# Patient Record
Sex: Female | Born: 1975 | Race: White | Hispanic: No | Marital: Married
Health system: Midwestern US, Community
[De-identification: ages and names within clinical notes are randomized; demographics above are authoritative.]

## PROBLEM LIST (undated history)

## (undated) DIAGNOSIS — G43109 Migraine with aura, not intractable, without status migrainosus: Secondary | ICD-10-CM

## (undated) DIAGNOSIS — M2550 Pain in unspecified joint: Secondary | ICD-10-CM

## (undated) DIAGNOSIS — R109 Unspecified abdominal pain: Secondary | ICD-10-CM

## (undated) DIAGNOSIS — M19049 Primary osteoarthritis, unspecified hand: Secondary | ICD-10-CM

## (undated) DIAGNOSIS — F419 Anxiety disorder, unspecified: Secondary | ICD-10-CM

## (undated) DIAGNOSIS — J45909 Unspecified asthma, uncomplicated: Secondary | ICD-10-CM

## (undated) DIAGNOSIS — M0609 Rheumatoid arthritis without rheumatoid factor, multiple sites: Principal | ICD-10-CM

## (undated) DIAGNOSIS — J40 Bronchitis, not specified as acute or chronic: Secondary | ICD-10-CM

## (undated) DIAGNOSIS — Z79899 Other long term (current) drug therapy: Secondary | ICD-10-CM

## (undated) DIAGNOSIS — F411 Generalized anxiety disorder: Secondary | ICD-10-CM

## (undated) DIAGNOSIS — H60502 Unspecified acute noninfective otitis externa, left ear: Secondary | ICD-10-CM

## (undated) DIAGNOSIS — R768 Other specified abnormal immunological findings in serum: Secondary | ICD-10-CM

## (undated) DIAGNOSIS — M1990 Unspecified osteoarthritis, unspecified site: Secondary | ICD-10-CM

## (undated) DIAGNOSIS — M359 Systemic involvement of connective tissue, unspecified: Secondary | ICD-10-CM

## (undated) DIAGNOSIS — M0600 Rheumatoid arthritis without rheumatoid factor, unspecified site: Principal | ICD-10-CM

---

## 2008-12-19 NOTE — Telephone Encounter (Signed)
I sent the Rx to the pharmacy.

## 2008-12-19 NOTE — Telephone Encounter (Signed)
RX for Aciphex 40mg  1xday marked over, please clear what is needed  [facility]  [phone]

## 2008-12-23 NOTE — Telephone Encounter (Signed)
Called Friday regarding 8/09 script filled 12/09, questions/double checking the total script Aciphex 40mg  (comes only in a 20mg ) Please clarify everything spoke with [place]y on Friday   [facility] [phone]

## 2009-04-30 NOTE — Progress Notes (Signed)
Subjective:      Patient ID: Peggy Sanders is a 33 y.o. female.    HPI  Patient has wakened in the night several times in the past month feeling like she was choking.  Last night she had an episode in which she woke up feeling like she was choking and then started vomiting yellow mocous.  Feels different than acid reflux, taking aciphex regularly.  She does have a h/o hiatal hernia- last evaluated about 5 years ago. No congestion.  She does have intermittent right sided sharp abdominal pain.  Nausea today.  She has not eaten today.  NO fever.  No change in appetite.  She has had some loose stools, yellow in color.  Mom had gallbladder issues.  NO sick contacts.  Last night, ate grilled chicken and mashed potatoes.    She also does have frequent snoring and wakes up frequently.  She also has daytime fatigue.    She quit smoking 2 weeks ago    Review of Systems   Constitutional: Negative for fatigue and unexpected weight change.   HENT: Negative for congestion.    Respiratory: Negative for cough, chest tightness and wheezing.    Gastrointestinal: Positive for nausea, vomiting and abdominal pain.       Objective:   Physical Exam   Nursing note and vitals reviewed.  Constitutional: She is oriented to person, place, and time. She appears well-developed and well-nourished.   HENT:   Head: Normocephalic and atraumatic.   Mouth/Throat: No oropharyngeal exudate.   Eyes: Conjunctivae are normal. Pupils are equal, round, and reactive to light.   Cardiovascular: Normal rate, regular rhythm and normal heart sounds.    Pulmonary/Chest: Effort normal and breath sounds normal. She has no wheezes.   Abdominal: Soft. Bowel sounds are normal. She exhibits no distension. Tenderness is present. She has no rebound and no guarding.        RUQ and RLQ tenderness to palpation   Neurological: She is alert and oriented to person, place, and time.   Psychiatric: Her behavior is normal.       Assessment/Plan      1) RUQ pain/nausea,  vomiting- check lfts, consider RUQ U/S  2) Snoring/Fatigue- sleep study

## 2009-05-01 ENCOUNTER — Telehealth

## 2009-05-01 LAB — COMPREHENSIVE METABOLIC PANEL
ALT: 26 U/L (ref 10–40)
AST: 20 U/L (ref 15–37)
Albumin/Globulin Ratio: 1.4 (ref 1.1–2.2)
Albumin: 4.3 g/dL (ref 3.4–5.0)
Alkaline Phosphatase: 74 U/L
BUN: 8 mg/dL (ref 7–18)
CO2: 22 meq/L (ref 21–32)
Calcium: 9.2 mg/dL (ref 8.3–10.6)
Chloride: 108 meq/L (ref 99–110)
Creatinine: 0.7 mg/dL (ref 0.6–1.1)
GFR Est, African/Amer: >60
GFR, Estimated: >60 (ref >60)
Glucose: 88 mg/dL (ref 70–99)
Potassium: 3.6 meq/L (ref 3.5–5.1)
Sodium: 140 meq/L (ref 136–145)
Total Bilirubin: 0.5 mg/dL (ref 0.0–1.0)
Total Protein: 7.3 g/dL (ref 6.4–8.2)

## 2009-05-01 LAB — CBC
Hematocrit: 42.8 % (ref 36.0–48.0)
Hemoglobin: 14.9 g/dL (ref 12.0–16.0)
MCH: 32.4 pg (ref 26–34)
MCHC: 34.7 g/dL (ref 31–36)
MCV: 93.4 fl (ref 80–100)
MPV: 6.9 fl (ref 5.0–10.5)
Platelets: 293 10*3 (ref 135–450)
RBC: 4.58 10*6 (ref 4.0–5.2)
RDW: 12.6 % (ref 11.5–14.5)
WBC: 9.7 10*3 (ref 4.0–11.0)

## 2009-05-01 NOTE — Telephone Encounter (Signed)
Spoke with the patient and she said that she wanted the results of the blood work she is still throwing up yellow bile and also yellowish diarrhea. She did not go to work today, still not feeling well.

## 2009-05-01 NOTE — Telephone Encounter (Signed)
I spoke with her.  BW results OK. Will Order u/s.  ER if increased sx

## 2009-05-01 NOTE — Telephone Encounter (Signed)
bw results

## 2009-05-01 NOTE — Telephone Encounter (Signed)
Calling back concern on BW results

## 2009-05-01 NOTE — Telephone Encounter (Signed)
BW results were normal.  Will order RUQ u/s.  If sx severe, should go to ER

## 2009-06-17 NOTE — Telephone Encounter (Signed)
Patient c/o cough, ear pain, and sore throat for 1 day.  No fever.  Cough is non- productive.  She feels like she is breathing in cold air.  She is taking Nyquil with minimal help.  States allergic to codeine but only in pill form.  Phenergan with codeine called to [facility] 150 ml No RFs  Call for OV in am

## 2009-06-19 NOTE — Progress Notes (Signed)
Subjective:      Patient ID: Peggy Sanders is a 33 y.o. female.    HPI  Patient has been having cough and shortness of breath since Monday night.  She is wheezing.  She is also having a lot of sinus pressure and drainage.  No fevers.  Body aches.  She is taking phenergan with codeine which helps with the cough.  Decreased appetite.  Min fluids.      Review of Systems   Constitutional: Positive for fatigue. Negative for unexpected weight change.   Respiratory: Positive for cough, shortness of breath and wheezing. Negative for chest tightness.    Cardiovascular: Negative for chest pain.   Neurological: Negative for dizziness.   Psychiatric/Behavioral: The patient is not nervous/anxious.        Objective:   Physical Exam   Nursing note and vitals reviewed.  Constitutional: She appears well-developed and well-nourished.   HENT:   Head: Normocephalic and atraumatic.   Mouth/Throat: Oropharynx is clear and moist.   Eyes: Conjunctivae are normal. Pupils are equal, round, and reactive to light.   Cardiovascular: Normal rate, regular rhythm and normal heart sounds.    Pulmonary/Chest: She has wheezes.        Decreased breath sounds throughout       Assessment/Plan      1) Bronchitis- albuterol neb x 2, z pack, albuterol MDI, RF phenergan with codeine, encouraged cont tobacco cessation

## 2009-06-20 ENCOUNTER — Telehealth

## 2009-06-20 NOTE — Telephone Encounter (Signed)
Spoke with pt she was here yest and had nebulizer rx felt much better after treatment inhaler not working as well chest tight worse when lying down not getting any rest on cough med and antibiotic  Dr peeden told her we could order nebulizer if she needed  It Talked to [facility] they can get nebulizer need to fax order for meds and demographic sheet to them

## 2009-06-20 NOTE — Telephone Encounter (Signed)
Patient called and said that husband would be willing to pick up the nebulizer [phone] if necessary, call so that she knows the status of this request. thanks

## 2009-06-20 NOTE — Telephone Encounter (Signed)
Still wheezing and feeling bad.  Said Dr. Bethena Roys told her she could get a temporary breathing treatment maching for home.

## 2009-06-23 NOTE — Telephone Encounter (Signed)
Later on Friday evening, pt called back - [facility] did not have any medicine (albuterol) - I called in albuterol unit dose nebules (2.5 mL), #60 and no refills, to use q 4-6 hrs prn, to her requested pharmacy, that evening (20/2/10).

## 2009-06-23 NOTE — Telephone Encounter (Signed)
I called pt that evening - [facility] to deliver nebulizer Friday evening.

## 2009-11-03 MED ORDER — RIZATRIPTAN BENZOATE 10 MG PO TBDP
10 | ORAL_TABLET | Freq: Once | ORAL | Status: DC | PRN
Start: 2009-11-03 — End: 2010-09-22

## 2009-11-03 NOTE — Progress Notes (Signed)
Subjective:      Patient ID: Peggy Sanders is a 34 y.o. female.    HPI  Patient has had a bump in right wrist that has been getting larger and is now painful.  She works at a [employer] and does a lot of repetitive activity with her wrist.  No known trauma or history of previous cysts.    She is also interested in quitting smoking.  She smokes less that 1/2 ppd for several years.  She is interested in starting Chantix. She is concerned about weight gain if she quits smoking.    Review of Systems   Constitutional: Negative for unexpected weight change.   Respiratory: Negative for chest tightness and shortness of breath.    Cardiovascular: Negative for chest pain.   Musculoskeletal:        Pain on right wrist   Neurological: Negative for dizziness.   Psychiatric/Behavioral: Positive for sleep disturbance. The patient is not nervous/anxious.        Objective:   Physical Exam   Cardiovascular: Normal rate and regular rhythm.    Pulmonary/Chest: Effort normal and breath sounds normal.   Musculoskeletal:        1 cm palpable mobile cyst right lateral wrist that is ttp.       Assessment/Plan:      1) Cyst right wrist- refer ortho  2) Tobacco use- trial Chantix, discussed potential s/e of medication, encouraged regular exercise to help combat any potential weight gain

## 2009-11-12 NOTE — Telephone Encounter (Signed)
OV

## 2009-11-12 NOTE — Telephone Encounter (Signed)
Trying to schedule surgery for 11-14-09.  Needs pre-op paperwork filled out before hand.  Do you need to see her or can she just drop the forms off to be filled out.  She states you just saw her last week.

## 2009-11-12 NOTE — Telephone Encounter (Signed)
Office visit made.

## 2009-11-13 NOTE — Progress Notes (Signed)
[facility]   Pre-operative History and Physical      DIAGNOSIS:  Cyst right wrist    PROCEDURE:  Removal of cyst right wrist      History Obtained From:  patient    HISTORY OF PRESENT ILLNESS:    The patient is a 34 y.o. female who presents for pre-operative evaluation.    Past Medical History:    Past Medical History   Diagnosis Date   ??? GERD (gastroesophageal reflux disease)    ??? PCOS (polycystic ovarian syndrome)        Past Surgical History:    Past Surgical History   Procedure Date   ??? Tubal ligation    ??? Atrial ablation surgery    ??? Adenoidectomy    ??? Inner ear surgery      tubes inserted       Current Medication  Current outpatient prescriptions   Medication Sig Dispense Refill   ??? rabeprazole (ACIPHEX) 20 MG tablet Take 1 tablet by mouth 2 times daily.  60 tablet  6   ??? rizatriptan (MAXALT-MLT) 10 MG disintegrating tablet Take 1 tablet by mouth once as needed for Migraine for 1 dose. May repeat in 2 hours if needed  9 tablet  3   ??? varenicline (CHANTIX STARTING MONTH PAK) 0.5 MG X 11 & 1 MG X 42 tablet Take  by mouth See Admin Instructions for 90 days. Take as directed  1 tablet  0   ??? varenicline (CHANTIX CONTINUING MONTH PAK) 1 MG tablet Take 1 tablet by mouth 2 times daily for 90 days.  60 tablet  2   ??? albuterol (PROVENTIL) (2.5 MG/3ML) 0.083% nebulizer solution Take 3 mLs by nebulization every 6 hours as needed for Wheezing.  60 vial  0           Allergies:  Codeine  History of allergic reaction to anesthesia:  No  Teeth: intact    Social History:   History   Smoking status   ??? Former Smoker   ??? Quit date: 04/20/2009   Smokeless tobacco   ??? Never Used       The patient states she drinks 0 per week.    Family History:   Family History   Problem Relation Age of Onset   ??? Other Mother      lykinplantis   ??? Heart Attack Father    ??? Other Father          REVIEW OF SYSTEMS:    CONSTITUTIONAL:  negative  EYES:  negative  HEENT:  negative  RESPIRATORY:  negative  CARDIOVASCULAR:   negative  GASTROINTESTINAL:  negative  GENITOURINARY:  negative  INTEGUMENT/BREAST:  negative  HEMATOLOGIC/LYMPHATIC:  negative  ALLERGIC/IMMUNOLOGIC:  negative  ENDOCRINE:  negative  MUSCULOSKELETAL:  negative  NEUROLOGICAL:  negative    PHYSICAL EXAM:      BP 110/62   Pulse 80   Ht 5' 3.5" (1.613 m)   Wt 275 lb 6.4 oz (124.921 kg)   BMI 48.02 kg/m2      Eyes:  Lids and lashes normal, pupils equal, round and reactive to light, extra ocular muscles intact, sclera clear, conjunctiva normal    Head/ENT:  Normocephalic, without obvious abnormality, atramatic, sinuses nontender on palpation, external ears without lesions, oral pharynx with moist mucus membranes, tonsils without erythema or exudates, gums normal and good dentition.    Neck:  Supple, symmetrical, trachea midline, no adenopathy, thyroid symmetric, not enlarged and no tenderness, skin normal  Heart:  Normal apical impulse, regular rate and rhythm, normal S1 and S2, no S3 or S4, and no murmur noted    Lungs:  No increased work of breathing, good air exchange, clear to auscultation bilaterally, no crackles or wheezing    Abdomen:  No scars, normal bowel sounds, soft, non-distended, non-tender, no masses palpated, no hepatosplenomegally    Extremities:  No clubbing, cyanosis, or edema      ASSESSMENT AND PLAN:    1.  Patient is a 34 y.o. female with above specified procedure planned on removal of cyst with Dr. Elige Ko. They will require cardiology evaluation prior to procedure.   2. Decrease tobacco use prior to procedure.

## 2010-01-12 NOTE — Progress Notes (Signed)
Subjective:      Patient ID: Peggy Sanders is a 34 y.o. female.    HPI  Patient states that she has been sick for about 10 days.  She has been using inhaler and singulair and mucinex but still can't breathe.  She is having a lot of facial pressure and chest tightness.  Facial pain is worse on left/  She is having wheezing, worse at night.  Her cough is non-productive. She had fever initially.  No allergy sx.  She is trying to quit smoking but has not started Chantix.      Review of Systems   Constitutional: Positive for fatigue. Negative for unexpected weight change.   HENT: Positive for congestion.    Respiratory: Positive for cough, chest tightness and shortness of breath.    Cardiovascular: Negative for chest pain.   Neurological: Negative for dizziness.   Psychiatric/Behavioral: The patient is not nervous/anxious.        Objective:   Physical Exam   Nursing note and vitals reviewed.  Constitutional: She appears well-developed and well-nourished.   HENT:   Head: Normocephalic.   Right Ear: Tympanic membrane normal.   Left Ear: Tympanic membrane is erythematous. A middle ear effusion is present.   Nose: No mucosal edema, rhinorrhea or septal deviation. Right sinus exhibits no frontal sinus tenderness. Left sinus exhibits no frontal sinus tenderness.   Mouth/Throat: Mucous membranes are normal. No oral lesions. Posterior oropharyngeal edema and posterior oropharyngeal erythema present. No oropharyngeal exudate.   Eyes: Conjunctivae are normal.   Cardiovascular: Normal rate, regular rhythm and normal heart sounds.    Pulmonary/Chest: Effort normal. No respiratory distress. She has wheezes.   Lymphadenopathy:     She has no cervical adenopathy.       Assessment:/Plan:        1) Bronchitis- albuterol neb in office, meds per orders, sample of symbicort, stressed importance of tobacco cessation

## 2010-03-25 NOTE — Progress Notes (Signed)
Subjective:      Peggy Sanders is a 34 y.o. female who presents for possible ear infection. Symptoms include left ear pain, plugged sensation in the left ear and swollen glands. Onset of symptoms was 5 days ago, worsening since that time. Patient states that about 1 week ago she started having eye twitching, left arm twitching didn't think anything of it. About 4-5 days ago started having ear pain and shooting pain upward and downward from left ear. Also has noticed in past 2 days left neck swelling just below left ear.     Review of Systems  Pertinent items are noted in HPI.    Objective:   BP 128/64   Temp 98.1 ??F (36.7 ??C)   Wt 270 lb 3.2 oz (122.562 kg)  General:  alert, appears stated age and cooperative   Right Ear: normal appearance with cholesteatoma   Left Ear: left TM erythematous and cholesteatoma postero-inferior and left canal red, tender with movement of pinna and what appears to be lesion about 10:00 approximately 0.5 cm prior to TM there is some swelling of canal    Mouth:  normal findings: lips normal without lesions and tongue midline and normal and abnormal findings: mild oropharyngeal erythema   Neck: mild anterior cervical adenopathy, supple, symmetrical, trachea midline, thyroid: difficult to exam and some swelling just below ear into jaw line.        Assessment:     left acute otitis externa vs. [place] hunt    Plan:   Labs: cbc w/diff, TSH, HSV & Zoster viral swab.  Treatment: Augmentin  Ibuprofen, fluids, rest, avoid carbonated/alcoholic or caffeinated beverages.   Follow up in 3 days if not improving.

## 2010-03-25 NOTE — Telephone Encounter (Signed)
Ear hurts  Employer has just returned form vacation- and has shingles  She has no rash that she can see, but does have "horrible pain"    For now, use the ear drops, take the 800  Mg IBF, and call if any rash appears in the affected area.   Once cx Lupita Leash did returns, we will let her know

## 2010-03-26 LAB — CBC WITH AUTO DIFFERENTIAL
Basophils %: 0.5 % (ref 0.0–2.0)
Basophils Absolute: 0 10*3 (ref 0.0–0.2)
Eosinophils %: 0.3 % (ref 0.0–5.0)
Eosinophils Absolute: 0 10*3 (ref 0.0–0.6)
Granulocyte Absolute Count: 6.5 10*3 (ref 1.7–7.7)
Hematocrit: 42 % (ref 36.0–48.0)
Hemoglobin: 14.8 gm/dl (ref 12.0–16.0)
Lymphocytes %: 24.9 % — ABNORMAL LOW (ref 25.0–40.0)
Lymphocytes Absolute: 2.3 10*3 (ref 1.0–5.1)
MCH: 31.9 pg (ref 26–34)
MCHC: 35.1 gm/dl (ref 31–36)
MCV: 90.8 fl (ref 80–100)
MPV: 7.2 fl (ref 5.0–10.5)
Monocytes %: 4.9 % (ref 0.0–12.0)
Monocytes Absolute: 0.5 10*3 (ref 0.0–1.3)
Platelets: 356 10*3 (ref 135–450)
RBC: 4.63 10*6 (ref 4.0–5.2)
RDW: 12.7 % (ref 11.5–14.5)
Segs Relative: 69.4 % — ABNORMAL HIGH (ref 42.0–63.0)
WBC: 9.3 10*3 (ref 4.0–11.0)

## 2010-03-26 LAB — TSH: TSH: 2.17 u[IU]/mL (ref 0.35–5.5)

## 2010-03-26 NOTE — Telephone Encounter (Signed)
I spoke with the patient and let her know the results are her but will address them with her tomorrow after you look over them.

## 2010-03-26 NOTE — Telephone Encounter (Signed)
BW and  X-ray results, concern done yesterday/saw Lupita Leashonna

## 2010-03-26 NOTE — Telephone Encounter (Signed)
Blood work including thyroid normal. X ray normal as well. May need to have more imaging of neck if swelling persists. Have not received viral studies/results back yet. Will call when we get that back.

## 2010-03-27 NOTE — Telephone Encounter (Signed)
She wanted to know if cbc was ok as well.

## 2010-03-27 NOTE — Telephone Encounter (Signed)
Called patient. She is still having ear pain only slightly improved she rated 9-10 on a 1-10 scale constant ache with occasional sharp shooting pain with swallowing and certain movements. Result of lesion culture/viral should be back today, will call patient this afternoon with results - if not available today will go ahead and start on Valtrex 1g three times a day for 7 days. She is continuing ear drops as well.

## 2010-03-27 NOTE — Telephone Encounter (Signed)
[employer] talked to [place]. HSV and viral culture still pending. Likely will be another 3 days.  Will go ahead and call in Valtrex 1g TID for 7 days. Will call patient when receive results. If negative may stop Valtrex.

## 2010-04-22 NOTE — Telephone Encounter (Signed)
Needing refills for her rescue inhaler Proventil, pharmacy listed.  (she is out, please call in today, the others were taken care of that this one was missed) thanks

## 2010-04-22 NOTE — Telephone Encounter (Signed)
Last appt 03/25/10

## 2010-09-22 MED ORDER — RIZATRIPTAN BENZOATE 10 MG PO TBDP
10 | ORAL_TABLET | Freq: Once | ORAL | Status: DC | PRN
Start: 2010-09-22 — End: 2013-11-12

## 2010-09-22 NOTE — Patient Instructions (Signed)
Discussed importance of tobacco cessation.

## 2010-09-22 NOTE — Progress Notes (Signed)
Subjective:      Patient ID: Peggy Sanders is a 35 y.o. female.    HPI  Patient states that right ear pain and congestion right side of face that started yesterday.  She thinks low grade fever yesterday.  She is getting thick mucous out of nose.  She was coughing a lot last night and did not sleep at all.  No wheezing but she is starting to have some chest tightness.  No sick contacts.  She has started smoking again.    She is traveling to [place] next week and requesting a prescription of valium to help with anxiety of flying.    Review of Systems   Constitutional: Negative for unexpected weight change.   HENT: Positive for ear pain and congestion.    Respiratory: Positive for cough and chest tightness. Negative for shortness of breath.    Cardiovascular: Negative for chest pain.   Neurological: Negative for dizziness.   Psychiatric/Behavioral: The patient is not nervous/anxious.        Objective:   Physical Exam   Nursing note and vitals reviewed.  Constitutional: She is oriented to person, place, and time. She appears well-developed and well-nourished.   HENT:   Head: Normocephalic and atraumatic.   Right Ear: Tympanic membrane is erythematous and bulging. A middle ear effusion is present.   Nose: Mucosal edema and rhinorrhea present.   Eyes: Conjunctivae are normal. Pupils are equal, round, and reactive to light.   Cardiovascular: Normal rate, regular rhythm and normal heart sounds.    Pulmonary/Chest: Effort normal and breath sounds normal.   Abdominal: Soft. Bowel sounds are normal. No tenderness.   Neurological: She is alert and oriented to person, place, and time.   Psychiatric: She has a normal mood and affect. Her behavior is normal.       Assessment:/Plan:        1) AOM/URI- meds per orders, increase fluids, RF Albuterol for prn use  2) Tobacco use- stressed importance of tobacco cessation, prescription for Chantix, discussed potential side effects of medication    3) Travel- Rx for valium for  flight

## 2011-01-04 MED ORDER — AMOXICILLIN-POT CLAVULANATE ER 1000-62.5 MG PO TB12
ORAL_TABLET | Freq: Two times a day (BID) | ORAL | Status: AC
Start: 2011-01-04 — End: 2011-01-18

## 2011-01-04 NOTE — Patient Instructions (Signed)
Contact office if increased or persistent symptoms.

## 2011-01-04 NOTE — Progress Notes (Signed)
Subjective:      Patient ID: Peggy Sanders is a 35 y.o. female.    HPI  Patient states that she started with congestion about 1 week ago.  This am she states that she is feeling a lot worse.  She is having sinus pressure and chest congestion.  No fevers but she did have achiness last night.  She is taking sudafed and tylenol with minimal help.    Review of Systems   Constitutional: Positive for fatigue. Negative for unexpected weight change.   HENT: Positive for congestion and sinus pressure.    Respiratory: Positive for cough. Negative for chest tightness and shortness of breath.    Cardiovascular: Negative for chest pain.   Neurological: Positive for headaches. Negative for dizziness.   Psychiatric/Behavioral: The patient is not nervous/anxious.        Objective:   Physical Exam   [nursing notereviewed.  Constitutional: She appears well-developed and well-nourished.   HENT:   Head: Normocephalic.   Right Ear: Tympanic membrane normal.   Left Ear: Tympanic membrane is erythematous. A middle ear effusion is present.   Nose: Mucosal edema and rhinorrhea present. No septal deviation. Right sinus exhibits no frontal sinus tenderness. Left sinus exhibits no frontal sinus tenderness.   Mouth/Throat: Mucous membranes are normal. No oral lesions. Posterior oropharyngeal edema and posterior oropharyngeal erythema present. No oropharyngeal exudate.   Eyes: Conjunctivae are normal.   Cardiovascular: Normal rate, regular rhythm and normal heart sounds.    Pulmonary/Chest: Effort normal and breath sounds normal. No respiratory distress. She has no wheezes.   Lymphadenopathy:     She has no cervical adenopathy.       Assessment:/Plan:        1) Sinusitis- antibiotics per orders, increase fluids, Mucinex, call if increased or persistent symptoms

## 2011-01-07 NOTE — Telephone Encounter (Signed)
Left message on recorder  To let the pt know that she has the script at the pharmacy.

## 2011-01-07 NOTE — Telephone Encounter (Signed)
Ok for dexilant 30 mg daily.  Please pit other meds in allergy section.

## 2011-01-07 NOTE — Telephone Encounter (Signed)
I spoke with the pt and she said she has tried the Nexium,Prilosec, and protonix, and they closed up her throat, her insurance covers the Dexilant pt needs to know if she needs 1 tablet or 2 normally the 1 tablet does not work, but she has not tried this medication either.

## 2011-01-08 NOTE — Telephone Encounter (Signed)
Insurance won't cover the dexilant either. She just wants the aciphex prior authorized. She said Lafonda Mosses talked with insurance company on the 17th. She would like Lafonda Mosses to call her at [phone].

## 2011-01-11 NOTE — Telephone Encounter (Signed)
I spoke with the pt and she said her Dexilant is not covered she wants me to get a PA for this medication I will check with the insurance

## 2011-01-12 NOTE — Telephone Encounter (Signed)
Order signed as phone in for phenergan with codeine.  Please call to pharmacy.

## 2011-01-12 NOTE — Telephone Encounter (Signed)
I spoke with the pharmacy and they do not make 40mg  on the Aciphex so I phoned the pt and she will try the Dexilant, I will give her a discount card also for this.  She also needs the cough medication she is still coughing and keeping her up at night,

## 2011-01-14 NOTE — Telephone Encounter (Signed)
Phoned in medication for pt. To pharm per Dr Bethena Roys

## 2011-02-22 NOTE — Telephone Encounter (Signed)
I spoke with the pt and she needs the 60 mg sent to the pharmacy she said the mg works better.

## 2011-02-22 NOTE — Telephone Encounter (Signed)
I sent the Rx to the pharmacy.

## 2011-02-22 NOTE — Telephone Encounter (Signed)
Regarding the new medication/Dexilant, was to be on 30 days, there were two different strengths, Dr. Wilmon Armsegt put her on the 60 mg, it is working, can this strength be called into pharmacy listed.  Asked for Lafonda MossesDiana to call her back regarding this.

## 2011-03-23 NOTE — Telephone Encounter (Signed)
Received form for Dexialant to be PA so I called pt to see what she has tried and she states that she has tried Protonix, Prilosec, Prevacid which she is allergic to, and has tried Nexium also which didn't work. Pt states she would like to see if her insurance will pay for Aciphex 20 mg once a day. Please send this to the pharmacy. Medication loaded.    I placed one sample of the dexilant at the front desk for the pt to pick up in case the Aciphex needs PA.

## 2011-03-29 NOTE — Telephone Encounter (Signed)
Pt called wanting to check on the status of the PA for the dexilant. I left pt a message that per our conversation on 03/23/11 she would rather have the Aciphex and for Dr. Bethena Roys send in Aciphex once a day to the pharmacy to see if the insurance will cover that.

## 2011-04-26 NOTE — Telephone Encounter (Signed)
error 

## 2012-02-16 MED ORDER — AZITHROMYCIN 250 MG PO TABS
250 MG | PACK | ORAL | Status: DC
Start: 2012-02-16 — End: 2012-10-03

## 2012-02-16 MED ORDER — ALBUTEROL SULFATE HFA 108 (90 BASE) MCG/ACT IN AERS
108 (90 Base) MCG/ACT | Freq: Four times a day (QID) | RESPIRATORY_TRACT | Status: DC | PRN
Start: 2012-02-16 — End: 2013-02-27

## 2012-02-16 MED ORDER — PHENYLEPH-PROMETHAZINE-COD 5-6.25-10 MG/5ML PO SYRP
ORAL | Status: DC | PRN
Start: 2012-02-16 — End: 2012-06-19

## 2012-02-16 NOTE — Progress Notes (Signed)
Subjective:      Patient ID: Peggy Sanders is a 36 y.o. female.    HPI  Patient started feeling ill on Saturday.  She has sinus congestion and headache.  She has decreased appetite cough, fevers and body aches.  She is having difficulty breathing and wheezing.  No sore throat.  She does have ear pressure and dizziness. She feels like she is rocking on a boat since Sunday.  No nausea or vomiting.  She has had loose stools.  No help with OTC products.  No sick contacts. She does smoke but has not been able to smoke much the past few days.       Review of Systems   Constitutional: Positive for fever and fatigue. Negative for unexpected weight change.   HENT: Positive for congestion and sinus pressure.    Respiratory: Positive for cough. Negative for chest tightness and shortness of breath.    Cardiovascular: Negative for chest pain.   Gastrointestinal: Positive for nausea. Negative for vomiting and diarrhea.   Neurological: Positive for dizziness and headaches.   Psychiatric/Behavioral: The patient is not nervous/anxious.        Objective:   Physical Exam   Nursing note and vitals reviewed.  Constitutional: She appears well-developed and well-nourished.   HENT:   Head: Normocephalic.   Right Ear: Tympanic membrane normal.   Left Ear: Tympanic membrane normal.   Nose: Mucosal edema and rhinorrhea present. No septal deviation. Right sinus exhibits no frontal sinus tenderness. Left sinus exhibits no frontal sinus tenderness.   Mouth/Throat: Mucous membranes are normal. No oral lesions. Posterior oropharyngeal edema and posterior oropharyngeal erythema present. No oropharyngeal exudate.   Eyes: Conjunctivae are normal.   Cardiovascular: Normal rate, regular rhythm and normal heart sounds.    Pulmonary/Chest: Effort normal. No respiratory distress. She has wheezes (throughout). She has rhonchi in the right lower field and the left lower field.   Lymphadenopathy:     She has no cervical adenopathy.        Assessment:/Plan:        1) Bronchitis vs CAP- meds per orders, call if increased sx, nebulizer in office, discussed importance of tobacco cessation

## 2012-02-16 NOTE — Patient Instructions (Signed)
Contact office if increased or persistent symptoms.

## 2012-05-29 MED ORDER — RABEPRAZOLE SODIUM 20 MG PO TBEC
20 MG | ORAL_TABLET | Freq: Every day | ORAL | Status: DC
Start: 2012-05-29 — End: 2013-06-22

## 2012-05-29 NOTE — Telephone Encounter (Signed)
Peggy Sanders is requesting refill(s) Aciphex  Last office visit 02-16-12 (pertaining to medication)  Last refill 09-23-11 (per medication requested)  Next office visit scheduled or attempted No   If "no"; explain reason: patient will make one if needed

## 2012-06-19 ENCOUNTER — Inpatient Hospital Stay
Admit: 2012-06-19 | Discharge: 2012-06-19 | Disposition: A | Discharge status: Home / Self Care | Attending: Emergency Medicine

## 2012-06-19 LAB — COMPREHENSIVE METABOLIC PANEL
ALT: 22 U/L (ref 10–40)
AST: 20 U/L (ref 15–37)
Albumin/Globulin Ratio: 1.2 (ref 1.1–2.2)
Albumin: 4.2 g/dL (ref 3.4–5.0)
Alkaline Phosphatase: 71 U/L (ref 45–129)
BUN: 9 mg/dL (ref 7–18)
CO2: 26 mEq/L (ref 21–32)
Calcium: 9 mg/dL (ref 8.3–10.6)
Chloride: 105 mEq/L (ref 99–110)
Creatinine: 0.8 mg/dL (ref 0.6–1.1)
GFR African American: >60 (ref >60)
GFR Non-African American: >60 (ref >60)
Globulin: 4 g/dL
Glucose: 113 mg/dL — ABNORMAL HIGH (ref 70–99)
Potassium: 3.6 mEq/L (ref 3.5–5.1)
Sodium: 138 mEq/L (ref 136–145)
Total Bilirubin: 0.44 mg/dL (ref 0.00–1.00)
Total Protein: 7.8 g/dL (ref 6.4–8.2)

## 2012-06-19 LAB — CBC WITH AUTO DIFFERENTIAL
Basophils %: 0.4 %
Basophils Absolute: 0 10*3/uL (ref 0.0–0.2)
Eosinophils %: 0.9 %
Eosinophils Absolute: 0.1 10*3/uL (ref 0.0–0.6)
Hematocrit: 44.9 % (ref 36.0–48.0)
Hemoglobin: 15.1 g/dL (ref 12.0–16.0)
Lymphocytes %: 24.3 %
Lymphocytes Absolute: 2.4 10*3/uL (ref 1.0–5.1)
MCH: 31.2 pg (ref 26.0–34.0)
MCHC: 33.8 g/dL (ref 31.0–36.0)
MCV: 92.3 fL (ref 80.0–100.0)
MPV: 7.4 fL (ref 5.0–10.5)
Monocytes %: 5.9 %
Monocytes Absolute: 0.6 10*3/uL (ref 0.0–1.3)
Neutrophils %: 68.5 %
Neutrophils Absolute: 6.7 10*3/uL (ref 1.7–7.7)
Platelets: 342 10*3/uL (ref 135–450)
RBC: 4.86 M/uL (ref 4.00–5.20)
RDW: 12.3 % — ABNORMAL LOW (ref 12.4–15.4)
WBC: 9.7 10*3/uL (ref 4.0–11.0)

## 2012-06-19 MED ORDER — ONDANSETRON HCL 8 MG PO TABS
8 MG | ORAL_TABLET | Freq: Three times a day (TID) | ORAL | Status: DC | PRN
Start: 2012-06-19 — End: 2012-10-03

## 2012-06-19 MED ORDER — OXYCODONE-ACETAMINOPHEN 5-325 MG PO TABS
5-325 MG | ORAL_TABLET | Freq: Four times a day (QID) | ORAL | Status: AC | PRN
Start: 2012-06-19 — End: 2012-06-26

## 2012-06-19 MED ORDER — IBUPROFEN 600 MG PO TABS
600 MG | ORAL_TABLET | Freq: Four times a day (QID) | ORAL | Status: DC | PRN
Start: 2012-06-19 — End: 2013-08-01

## 2012-06-19 MED ADMIN — 0.9 % sodium chloride bolus: INTRAVENOUS | @ 09:00:00 | NDC 00338004904

## 2012-06-19 MED ADMIN — ondansetron (ZOFRAN) injection 4 mg: INTRAVENOUS | @ 09:00:00 | NDC 00781301072

## 2012-06-19 MED ADMIN — HYDROmorphone HCl PF (DILAUDID) injection SOLN 1 mg: INTRAVENOUS | @ 09:00:00 | NDC 00409128331

## 2012-06-19 MED ADMIN — ketorolac (TORADOL) injection 30 mg: INTRAVENOUS | @ 09:00:00 | NDC 00409379501

## 2012-06-19 MED FILL — HYDROMORPHONE HCL PF 1 MG/ML IJ SOLN: 1 MG/ML | INTRAMUSCULAR | Qty: 1

## 2012-06-19 MED FILL — KETOROLAC TROMETHAMINE 30 MG/ML IJ SOLN: 30 MG/ML | INTRAMUSCULAR | Qty: 1

## 2012-06-19 MED FILL — ONDANSETRON HCL 4 MG/2ML IJ SOLN: 4 MG/2ML | INTRAMUSCULAR | Qty: 2

## 2012-06-19 NOTE — ED Notes (Signed)
IV d/c'd angio intact, dry dressing applied.  Discharge instructions and medications reviewed with pt, pt verbalized understanding.  Pt ambulated out of department in good condition.    Alison Murray, RN  06/19/12 [phone]

## 2012-06-19 NOTE — ED Provider Notes (Signed)
Patient is a 36 y.o. female presenting with headaches. The history is provided by the patient and the spouse.   Headache  The primary symptoms include headaches. Primary symptoms do not include syncope, loss of consciousness, altered mental status, seizures, dizziness, visual change, paresthesias, focal weakness, loss of sensation, speech change, memory loss, fever, nausea or vomiting. The symptoms began 3 to 5 days ago. The episode lasted 4 days. The symptoms are unchanged. The neurological symptoms are focal.   The headache is associated with photophobia. The headache is not associated with aura, visual change, neck stiffness or paresthesias.   Additional symptoms include photophobia and anxiety. Additional symptoms do not include neck stiffness, pain, lower back pain, leg pain, aura, hyperacusis, hearing loss, tinnitus or irritability. Medical issues do not include seizures.       PAST MEDICAL HISTORY   has a past medical history of GERD (gastroesophageal reflux disease) and PCOS (polycystic ovarian syndrome).    PAST SURGICAL HISTORY   has past surgical history that includes Tubal ligation; Atrial ablation surgery; Adenoidectomy; and Inner ear surgery.    FAMILY HISTORY  family history includes Heart Attack in her father and Other in her father and mother.    SOCIAL HISTORY   reports that she has been smoking Cigarettes.  She has been smoking about 0.50 packs per day. She has never used smokeless tobacco. She reports that  drinks alcohol. She reports that she does not use illicit drugs.    HOME MEDICATIONS     Prior to Admission medications    Medication Sig Start Date End Date Taking? Authorizing Provider   RABEprazole (ACIPHEX) 20 MG tablet Take 1 tablet by mouth daily. 05/29/12 05/29/13  Serita Grammes, MD   albuterol (PROVENTIL HFA) 108 (90 BASE) MCG/ACT inhaler Inhale 2 puffs into the lungs every 6 hours as needed for Wheezing. 02/16/12 02/15/13  Serita Grammes, MD   albuterol (PROVENTIL HFA;VENTOLIN HFA)  108 (90 BASE) MCG/ACT inhaler Inhale 2 puffs into the lungs every 6 hours as needed. 09/22/10   Serita Grammes, MD        ALLERGIES  is allergic to codeine; nexium; prevacid; and protonix.       Review of Systems   Constitutional: Negative for fever, chills, diaphoresis, activity change, appetite change, irritability and fatigue.   HENT: Positive for neck pain. Negative for hearing loss, congestion, sore throat, facial swelling, rhinorrhea, mouth sores, trouble swallowing, neck stiffness, voice change, sinus pressure and tinnitus.    Eyes: Positive for photophobia.   Respiratory: Negative for chest tightness.    Cardiovascular: Negative for chest pain, leg swelling and syncope.   Gastrointestinal: Negative for nausea, vomiting and abdominal pain.   Neurological: Positive for headaches. Negative for dizziness, tremors, speech change, focal weakness, seizures, loss of consciousness, syncope, facial asymmetry, speech difficulty, light-headedness, numbness and paresthesias.   Hematological: Negative for adenopathy. Does not bruise/bleed easily.   Psychiatric/Behavioral: Negative for memory loss and altered mental status.   All other systems reviewed and are negative.        Physical Exam   Nursing note and vitals reviewed.  Constitutional: Vital signs are normal. She appears well-developed.  Non-toxic appearance. She does not have a sickly appearance. She does not appear ill. She appears distressed.   HENT:   Head: Normocephalic.   Right Ear: Hearing and tympanic membrane normal.   Left Ear: Hearing and tympanic membrane normal.   Nose: Nose normal.   Mouth/Throat: Uvula is midline.  Eyes: Conjunctivae, EOM and lids are normal. Pupils are equal, round, and reactive to light.   Fundoscopic exam:       The right eye shows no AV nicking, no exudate, no hemorrhage and no papilledema.        The left eye shows no AV nicking, no exudate, no hemorrhage and no papilledema.   Neck: Normal carotid pulses, no hepatojugular reflux  and no JVD present. No muscular tenderness present. Carotid bruit is not present. No rigidity. Normal range of motion present. No Brudzinski's sign and no Kernig's sign noted.   Cardiovascular: Normal rate, regular rhythm, S1 normal and S2 normal.  Exam reveals no gallop.    No murmur heard.  Pulmonary/Chest: She has no decreased breath sounds. She has no wheezes. She has no rhonchi. She has no rales.   Abdominal: Normal appearance. There is no rigidity, no guarding, no tenderness at McBurney's point and negative Murphy's sign.   Musculoskeletal: Normal range of motion.   Lymphadenopathy:     She has no cervical adenopathy.     She has no axillary adenopathy.   Neurological: She is alert. GCS eye subscore is 4. GCS verbal subscore is 5. GCS motor subscore is 6.   Reflex Scores:       Tricep reflexes are 2+ on the right side and 2+ on the left side.       Bicep reflexes are 2+ on the right side and 2+ on the left side.       Brachioradialis reflexes are 2+ on the right side and 2+ on the left side.       Patellar reflexes are 2+ on the right side and 2+ on the left side.       Achilles reflexes are 2+ on the right side and 2+ on the left side.  Skin: No rash noted.   Psychiatric: Her speech is normal and behavior is normal. Her mood appears anxious.       Procedures    MDM    Labs  No results found for this visit on 06/19/12.      Radiology  CAT scan of head was interpreted by her [facility] as no acute processes including no signs of shifts mass leads aneurysm    EKG Interpretation.  Patient is feeling better at the time this dictation has no development neurological abnormalities and does feel comfortable going home      Lucilla Lame, MD  06/19/12 [phone]

## 2012-06-19 NOTE — Discharge Instructions (Signed)
Migraine Headache  A migraine headache is an intense, throbbing pain on one or both sides of your head. A migraine can last for 30 minutes to several hours.  CAUSES   The exact cause of a migraine headache is not always known. However, a migraine may be caused when nerves in the brain become irritated and release chemicals that cause inflammation. This causes pain.  SYMPTOMS   Pain on one or both sides of your head.   Pulsating or throbbing pain.   Severe pain that prevents daily activities.   Pain that is aggravated by any physical activity.   Nausea, vomiting, or both.   Dizziness.   Pain with exposure to bright lights, loud noises, or activity.   General sensitivity to bright lights, loud noises, or smells.  Before you get a migraine, you may get warning signs that a migraine is coming (aura). An aura may include:   Seeing flashing lights.   Seeing bright spots, halos, or zig-zag lines.   Having tunnel vision or blurred vision.   Having feelings of numbness or tingling.   Having trouble talking.   Having muscle weakness.  MIGRAINE TRIGGERS   Alcohol.   Smoking.   Stress.   Menstruation.   Aged cheeses.   Foods or drinks that contain nitrates, glutamate, aspartame, or tyramine.   Lack of sleep.   Chocolate.   Caffeine.   Hunger.   Physical exertion.   Fatigue.   Medicines used to treat chest pain (nitroglycerine), birth control pills, estrogen, and some blood pressure medicines.  DIAGNOSIS   A migraine headache is often diagnosed based on:   Symptoms.   Physical examination.   A CT scan or MRI of your head.  TREATMENT  Medicines may be given for pain and nausea. Medicines can also be given to help prevent recurrent migraines.   HOME CARE INSTRUCTIONS   Only take over-the-counter or prescription medicines for pain or discomfort as directed by your caregiver. The use of long-term narcotics is not recommended.   Lie down in a dark, quiet room when you have a migraine.   Keep a journal  to find out what may trigger your migraine headaches. For example, write down:   What you eat and drink.   How much sleep you get.   Any change to your diet or medicines.   Limit alcohol consumption.   Quit smoking if you smoke.   Get 7 to 9 hours of sleep, or as recommended by your caregiver.   Limit stress.   Keep lights dim if bright lights bother you and make your migraines worse.  SEEK IMMEDIATE MEDICAL CARE IF:    Your migraine becomes severe.   You have a fever.   You have a stiff neck.   You have vision loss.   You have muscular weakness or loss of muscle control.   You start losing your balance or have trouble walking.   You feel faint or pass out.   You have severe symptoms that are different from your first symptoms.  MAKE SURE YOU:    Understand these instructions.   Will watch your condition.   Will get help right away if you are not doing well or get worse.  Document Released: 09/06/2005 Document Revised: 11/29/2011 Document Reviewed: 08/27/2011  [facility] Patient Information 2013 [place]. INSTRUCTION SHEETS GIVEN:    DIAGNOSIS:  The encounter diagnosis was Headache, common migraine.        ADDITIONAL INSTRUCTIONS FOR ALL PATIENTS:  -  If you have been prescribed an antibiotic TAKE IT AS DIRECTED UNTIL IT IS ALL FINISHED.  -If you HAVE RECEIVED OR BEEN PRESCRIBED A MEDICATION THAT MAY CAUSE DROWSINESS. DO NOT DRIVE, DRINK ALCOHOL, OR OPERATE MACHINERY THAT REQUIRES YOU TO BE ALERT.    -If you had an EKG and/or X-Ray reading made in the Emergency Department, it will be reviewed by a Cardiologist and/or Radiologist. If the review changes your diagnosis, you will be contacted.  -If you had a specimen collected for culture, a CULTURE REPORT takes 48-72 hours to generate: You will be contacted if a change in treatment is needed.    -Return if your condition worsens or if you have severe pain, worsening of symptoms such as fever, vomiting or difficulty breathing.     If you were  prescribed an outpatient test Please Call [facility] at [phone]  to schedule a time for your test that was ordered.      If you have any trouble getting to see the physician that we have referred you to today, please call the [facility] at [phone].  Please leave a voice message if they are unavailable and they will return your call.

## 2012-06-19 NOTE — ED Notes (Signed)
Pt states that the pain medication has relieved her pain.    Ihor Austin, RN  06/19/12 [phone]

## 2012-06-21 MED ORDER — METHYLPREDNISOLONE 4 MG PO TBPK
4 MG | PACK | ORAL | Status: AC
Start: 2012-06-21 — End: 2012-06-27

## 2012-06-21 MED ORDER — MOXIFLOXACIN HCL 0.5 % OP SOLN
0.5 % | Freq: Three times a day (TID) | OPHTHALMIC | Status: AC
Start: 2012-06-21 — End: 2012-06-28

## 2012-06-21 MED ORDER — CYCLOBENZAPRINE HCL 10 MG PO TABS
10 MG | ORAL_TABLET | Freq: Three times a day (TID) | ORAL | Status: AC | PRN
Start: 2012-06-21 — End: 2012-07-01

## 2012-06-21 MED ORDER — METHYLPREDNISOLONE ACETATE 40 MG/ML IJ SUSP
40 MG/ML | Freq: Once | INTRAMUSCULAR | Status: DC
Start: 2012-06-21 — End: 2012-06-21

## 2012-06-21 MED ADMIN — methylPREDNISolone acetate (DEPO-MEDROL) injection 40 mg: INTRAMUSCULAR | @ 19:00:00 | NDC 00009028002

## 2012-06-21 NOTE — Progress Notes (Signed)
Subjective:      Patient ID: Peggy Sanders is a 36 y.o. female.    HPI  Patient is here for f/u headache.  She was seen in the ER 9/30 and was given dilaudid which helped temporarily but now headache is back. She states that about 10 years ago she had similar episode with headaches in which they would not go away.  She went to the hospital several times and saw Dr Julien Girt.  She was tried on multiple medications and none of them helped other than vioxx.  She states they finally just went away on their own.  No official diagnosis was ever made.  It affects her entire head including her eyes and radiates down into her neck and shoulders.  Pain is described as a constact throb that will occasionally be sharp in the back.  There is nausea and photophobia. Sleep helps. She drinks 1-2 cups of caffeine daily. She is s/p uterine ablation so she does not have periods.     Review of Systems   Constitutional: Negative for unexpected weight change.   Respiratory: Negative for chest tightness and shortness of breath.    Cardiovascular: Negative for chest pain.   Gastrointestinal: Positive for nausea.   Neurological: Positive for headaches. Negative for dizziness.   Psychiatric/Behavioral: The patient is not nervous/anxious.        Objective:   Physical Exam   Nursing note and vitals reviewed.  Constitutional: She is oriented to person, place, and time. She appears well-developed and well-nourished.   HENT:   Head: Normocephalic and atraumatic.   Eyes: Conjunctivae are normal. Pupils are equal, round, and reactive to light.   Cardiovascular: Normal rate, regular rhythm and normal heart sounds.    Pulmonary/Chest: Effort normal and breath sounds normal.   Abdominal: Soft. Bowel sounds are normal. There is no tenderness.   Musculoskeletal:   Increased muscle tension posterior neck with ttp neck and posterior scalp.   Neurological: She is alert and oriented to person, place, and time. A cranial nerve deficit is present.    Psychiatric: She has a normal mood and affect. Her behavior is normal.       Assessment:/Plan:        1) HA- migraine vs tension, depo medrol given today and medrol dose pack along with flexeril, consider neuro if persistent sx  Reviewed ER records, CT results  Contact office if increased or persistent symptoms.

## 2012-06-21 NOTE — Patient Instructions (Signed)
Contact office if increased or persistent symptoms.

## 2012-08-23 NOTE — Progress Notes (Signed)
Subjective:      Patient ID: Peggy Sanders is a 36 y.o. female.    HPI  36 y/o G70P1011 female presents for annual examination.  No menses or vaginal bleeding since ablation procedure.  Has had cyclical cramping that started recently.  Describes cyclical physiologic discharge as well.  No pelvic pain otherwise.  History significant for PCOS, severe dysplasia with normal follow up, and recurrent ovarian cysts.  Has also been diagnosed with endometriosis as well.  Sexually active, no problems, monogamous x 17 years.  Has not had recent blood work for PCOS.  Was prescribed metformin in the past but self-D/Ced the medication due to side effects.  Has not been on OCPs for PCOS due to smoking status.  States OCPs cause her to gain weight.     Review of Systems   Constitutional: Negative for fever, chills, activity change, appetite change, fatigue and unexpected weight change.   Respiratory: Negative for shortness of breath.    Cardiovascular: Negative for chest pain and palpitations.   Gastrointestinal: Positive for diarrhea (chronic). Negative for nausea, vomiting, abdominal pain, constipation and abdominal distention.   Endocrine: Negative for cold intolerance and heat intolerance.   Genitourinary: Negative for dysuria, urgency, frequency, hematuria, vaginal bleeding, vaginal discharge, difficulty urinating, genital sores, vaginal pain, menstrual problem, pelvic pain and dyspareunia.   Skin: Negative for rash.   Neurological: Positive for headaches (chronic).   Hematological: Does not bruise/bleed easily.   Psychiatric/Behavioral: Negative for behavioral problems. The patient is nervous/anxious.      + excessive hair growth    Objective:   Physical Exam   Nursing note and vitals reviewed.  Constitutional: She is oriented to person, place, and time. Vital signs are normal. She appears well-developed and well-nourished. She is active and cooperative.  Non-toxic appearance. She does not have a sickly appearance. She  does not appear ill. No distress.   HENT:   Head: Normocephalic and atraumatic.   Eyes: EOM are normal.   Neck: Trachea normal. Neck supple. No thyromegaly present.   Cardiovascular: Normal rate, regular rhythm, S1 normal, S2 normal and normal heart sounds.    Pulmonary/Chest: Effort normal and breath sounds normal. No respiratory distress. Right breast exhibits no inverted nipple, no mass, no nipple discharge, no skin change and no tenderness. Left breast exhibits no inverted nipple, no mass, no nipple discharge, no skin change and no tenderness. Breasts are symmetrical.   Abdominal: Soft. Normal appearance and bowel sounds are normal. She exhibits no distension and no mass. There is no tenderness. There is no rigidity, no rebound, no guarding, no tenderness at McBurney's point and negative Murphy's sign. No hernia. Hernia confirmed negative in the right inguinal area and confirmed negative in the left inguinal area.   Genitourinary: Uterus normal. No breast swelling, tenderness, discharge or bleeding. Pelvic exam was performed with patient supine. No labial fusion. There is no rash, tenderness, lesion or injury on the right labia. There is no rash, tenderness, lesion or injury on the left labia. Uterus is not tender. Cervix exhibits no motion tenderness, no discharge and no friability. Right adnexum displays no mass, no tenderness and no fullness. Left adnexum displays no mass, no tenderness and no fullness. No erythema, tenderness or bleeding around the vagina. No signs of injury around the vagina. No vaginal discharge found.   Musculoskeletal: Normal range of motion.   Lymphadenopathy:        Right: No inguinal adenopathy present.  Left: No inguinal adenopathy present.   Neurological: She is alert and oriented to person, place, and time.   Skin: Skin is warm, dry and intact. No rash noted. No cyanosis or erythema. Nails show no clubbing.   Psychiatric: She has a normal mood and affect. Her speech is normal  and behavior is normal. Judgment and thought content normal. Cognition and memory are normal.   Acanthosis nigricans noted    Facial and abdominal hirsutism noted.       Assessment:      1. Routine gynecological examination  PAP SMEAR - [facility], Screening, Mammography screening bilateral   2. PCOS (polycystic ovarian syndrome)  DHEA-Sulfate, Estradiol, Follicle Stimulating Hormone, Glucose tolerance, 2 hours, TSH with Reflex FT4, Prolactin, PROGEST 17-OH STIMULATD, Luteinizing Hormone, Hemoglobin A1c, Testosterone, free, total   3. History of cervical dysplasia     4. History of ovarian cystectomy     5. History of endometriosis     6. History of tubal ligation     7. Active smoker     8. Obesity, Class III, BMI 40-49.9 (morbid obesity)             Plan:      PCOS labs, mammogram, pap smear.  Await results.   Smoking cessation  Consider ultrasound in the future  Weight loss

## 2012-08-27 LAB — HUMAN PAPILLOMAVIRUS (HPV) DNA PROBE SUREPATH HIGH RISK: HPV DNA: NEGATIVE

## 2012-10-03 ENCOUNTER — Encounter

## 2012-10-03 MED ORDER — ALBUTEROL SULFATE (5 MG/ML) 0.5% IN NEBU
Freq: Four times a day (QID) | RESPIRATORY_TRACT | Status: AC | PRN
Start: 2012-10-03 — End: ?

## 2012-10-03 MED ORDER — METHYLPREDNISOLONE 4 MG PO TBPK
4 MG | PACK | ORAL | Status: AC
Start: 2012-10-03 — End: 2012-10-09

## 2012-10-03 MED ORDER — PROMETHAZINE-CODEINE 6.25-10 MG/5ML PO SYRP
ORAL | Status: AC | PRN
Start: 2012-10-03 — End: 2012-10-10

## 2012-10-03 MED ORDER — AZITHROMYCIN 250 MG PO TABS
250 MG | PACK | ORAL | Status: AC
Start: 2012-10-03 — End: 2012-10-13

## 2012-10-03 NOTE — Progress Notes (Signed)
Subjective:      Patient ID: Peggy Sanders is a 37 y.o. female.    HPI  Patient is here with cough and chest tightness for the past week.  She feels like she just can't clear anything.  She has wheezing and fevers.  She is taking mucinex with no help and using inhaler with no help. She is trying to quit smoking. No sick contacts.  She feels like her cough is getting progressively worse, not productive but feels wet.  She states this feels worse than her normal bronchitis.     Review of Systems   Constitutional: Positive for fever and fatigue. Negative for unexpected weight change.   Respiratory: Positive for cough, chest tightness, shortness of breath and wheezing.    Cardiovascular: Negative for chest pain.   Neurological: Negative for dizziness.   Psychiatric/Behavioral: The patient is not nervous/anxious.        Objective:   Physical Exam   Nursing note and vitals reviewed.  Constitutional: She is oriented to person, place, and time. She appears well-developed and well-nourished.   HENT:   Head: Normocephalic and atraumatic.   Eyes: Conjunctivae are normal. Pupils are equal, round, and reactive to light.   Cardiovascular: Normal rate, regular rhythm and normal heart sounds.    Pulmonary/Chest: Effort normal. She has wheezes.   Decreased breath sounds throughout   Abdominal: There is no tenderness.   Neurological: She is alert and oriented to person, place, and time.   Psychiatric: She has a normal mood and affect. Her behavior is normal.     Albuterol neb x 2 with improvement  Assessment:/Plan:        1) Bronchitis- meds per orders, call if increased sx. Check CXR given associated fever  Stressed importance of tobacco cessation

## 2012-11-07 NOTE — ED Notes (Signed)
Pt medicated per [facility] for pain and nausea. Pt tolerated well.     Bascom Levels, RN  11/07/12 [id number]

## 2012-11-08 ENCOUNTER — Inpatient Hospital Stay: Admit: 2012-11-08 | Discharge: 2012-11-08 | Attending: Emergency Medicine

## 2012-11-08 MED ORDER — MELOXICAM 15 MG PO TABS
15 MG | ORAL_TABLET | Freq: Every day | ORAL | Status: DC
Start: 2012-11-08 — End: 2015-02-24

## 2012-11-08 MED ORDER — HYDROCODONE-ACETAMINOPHEN 5-325 MG PO TABS
5-325 MG | ORAL_TABLET | Freq: Four times a day (QID) | ORAL | Status: DC | PRN
Start: 2012-11-08 — End: 2013-08-01

## 2012-11-08 MED ADMIN — ondansetron (ZOFRAN-ODT) disintegrating tablet 4 mg: ORAL | @ 05:00:00 | NDC 00781523806

## 2012-11-08 MED ADMIN — naproxen (NAPROSYN) tablet 500 mg: ORAL | @ 05:00:00 | NDC 53746019001

## 2012-11-08 MED ADMIN — oxyCODONE-acetaminophen (PERCOCET) 5-325 MG per tablet 1 tablet: ORAL | @ 05:00:00 | NDC 68084035511

## 2012-11-08 MED FILL — NAPROXEN 500 MG PO TABS: 500 MG | ORAL | Qty: 1

## 2012-11-08 MED FILL — ROXICET 5-325 MG PO TABS: 5-325 MG | ORAL | Qty: 1

## 2012-11-08 MED FILL — ONDANSETRON 4 MG PO TBDP: 4 MG | ORAL | Qty: 1

## 2012-11-08 NOTE — ED Provider Notes (Signed)
HPI Comments: Several days ago the patient slipped on the ice and hit her lumbar area now with persistent pain.  No numbness tingling or weakness.  No bowel or bladder problems.  No previous injury to her back.  No other injury his neck chest abdominal extremity or head trauma.  No loss of consciousness at the time.    Patient is a 37 y.o. female presenting with back pain. The history is provided by the patient and the spouse.   Back Pain      Review of Systems   Musculoskeletal: Positive for back pain.   All other systems reviewed and are negative.        Physical Exam   Vitals reviewed.  Constitutional: She is oriented to person, place, and time. She appears well-developed and well-nourished.   obese   HENT:   Head: Normocephalic and atraumatic.   Eyes: Conjunctivae and EOM are normal. Pupils are equal, round, and reactive to light.   Neck: Normal range of motion. Neck supple.   Cardiovascular: Normal rate, regular rhythm, normal heart sounds and intact distal pulses.    Pulmonary/Chest: Effort normal and breath sounds normal. She exhibits no tenderness.   Abdominal: Soft. Bowel sounds are normal. There is no tenderness.   Musculoskeletal: Normal range of motion.   Neurological: She is alert and oriented to person, place, and time.   Skin: Skin is warm and dry. She is not diaphoretic.   Psychiatric: She has a normal mood and affect. Her behavior is normal.       Procedures    MDM    Labs      Radiology  Lumbar x-ray interpreted by me: No fracture no dislocation    EKG Interpretation.        PAST MEDICAL HISTORY   has a past medical history of GERD (gastroesophageal reflux disease); PCOS (polycystic ovarian syndrome); Cervical intraepithelial neoplasia grade III with severe dysplasia (2008); History of ovarian cyst (2008); History of endometriosis (2008); and History of trichomoniasis (2008).    PAST SURGICAL HISTORY   has past surgical history that includes Tubal ligation; Endometrial ablation (2008);  Adenoidectomy; Inner ear surgery; Cervix surgery; and Dilation & curettage (1993).    FAMILY HISTORY  family history includes Cancer in her maternal grandmother and other family members; Heart Attack in her father; and Other in her father and mother.    SOCIAL HISTORY   reports that she has been smoking Cigarettes.  She has been smoking about 0.50 packs per day. She has never used smokeless tobacco. She reports that  drinks alcohol. She reports that she does not use illicit drugs.    HOME MEDICATIONS     Prior to Admission medications    Medication Sig Start Date End Date Taking? Authorizing Provider   HYDROcodone-acetaminophen (NORCO) 5-325 MG per tablet Take 1 tablet by mouth every 6 hours as needed for Pain for 12 doses. 11/08/12  Yes Matilde Bash, MD   meloxicam (MOBIC) 15 MG tablet Take 1 tablet by mouth daily for 15 doses. 11/08/12 11/23/12 Yes Matilde Bash, MD   albuterol (PROVENTIL) (5 MG/ML) 0.5% nebulizer solution Take 1 mL by nebulization 4 times daily as needed for Wheezing. 10/03/12   Gwendel Hanson, MD   cyclobenzaprine (FLEXERIL) 10 MG tablet Take 10 mg by mouth as needed.    Historical Provider, MD   ibuprofen (IBU) 600 MG tablet Take 1 tablet by mouth every 6 hours as needed for Pain for 20 doses.  06/19/12   Kem Parkinson, MD   RABEprazole (ACIPHEX) 20 MG tablet Take 1 tablet by mouth daily. 05/29/12 05/29/13  Gwendel Hanson, MD   albuterol (PROVENTIL HFA) 108 (90 BASE) MCG/ACT inhaler Inhale 2 puffs into the lungs every 6 hours as needed for Wheezing. 02/16/12 02/15/13  Gwendel Hanson, MD   ibuprofen (IBU) 800 MG tablet Take 1 tablet by mouth every 8 hours as needed for Pain. 03/25/10 03/25/11  Darliss Ridgel, PA        ALLERGIES  is allergic to esomeprazole magnesium; lansoprazole; protonix; and tylenol with codeine #3.       12:52 AM  Patient feels better after the pain medicines given here.  She remained neurologically intact.    12:52 AM  Discussed results, diagnosis and plan  with patient and/or family.  Questions addressed.  Disposition and follow-up agreed upon.  Specific discharge instructions explained, including reasons to return to the emergency department.              Matilde Bash, MD  11/08/12 [phone]

## 2012-11-08 NOTE — Discharge Instructions (Signed)
Lumbosacral Strain  Lumbosacral strain is one of the most common causes of back pain. There are many causes of back pain. Most are not serious conditions.  CAUSES   Your backbone (spinal column) is made up of 24 main vertebral bodies, the sacrum, and the coccyx. These are held together by muscles and tough, fibrous tissue (ligaments). Nerve roots pass through the openings between the vertebrae. A sudden move or injury to the back may cause injury to, or pressure on, these nerves. This may result in localized back pain or pain movement (radiation) into the buttocks, down the leg, and into the foot. Sharp, shooting pain from the buttock down the back of the leg (sciatica) is frequently associated with a ruptured (herniated) disk. Pain may be caused by muscle spasm alone.  Your caregiver can often find the cause of your pain by the details of your symptoms and an exam. In some cases, you may need tests (such as X-rays). Your caregiver will work with you to decide if any tests are needed based on your specific exam.  HOME CARE INSTRUCTIONS    Avoid an underactive lifestyle. Active exercise, as directed by your caregiver, is your greatest weapon against back pain.   Avoid hard physical activities (tennis, racquetball, waterskiing) if you are not in proper physical condition for it. This may aggravate or create problems.   If you have a back problem, avoid sports requiring sudden body movements. Swimming and walking are generally safer activities.   Maintain good posture.   Avoid becoming overweight (obese).   Use bed rest for only the most extreme, sudden (acute) episode. Your caregiver will help you determine how much bed rest is necessary.   For acute conditions, you may put ice on the injured area.   Put ice in a plastic bag.   Place a towel between your skin and the bag.   Leave the ice on for 15 to 20 minutes at a time, every 2 hours, or as needed.   After you are improved and more active, it may help  to apply heat for 30 minutes before activities.  See your caregiver if you are having pain that lasts longer than expected. Your caregiver can advise appropriate exercises or therapy if needed. With conditioning, most back problems can be avoided.  SEEK IMMEDIATE MEDICAL CARE IF:    You have numbness, tingling, weakness, or problems with the use of your arms or legs.   You experience severe back pain not relieved with medicines.   There is a change in bowel or bladder control.   You have increasing pain in any area of the body, including your belly (abdomen).   You notice shortness of breath, dizziness, or feel faint.   You feel sick to your stomach (nauseous), are throwing up (vomiting), or become sweaty.   You notice discoloration of your toes or legs, or your feet get very cold.   Your back pain is getting worse.   You have a fever.  MAKE SURE YOU:    Understand these instructions.   Will watch your condition.   Will get help right away if you are not doing well or get worse.  Document Released: 06/16/2005 Document Revised: 11/29/2011 Document Reviewed: 12/06/2008  [facility] Patient Information 2013 [place].

## 2013-02-27 MED ORDER — PROVENTIL HFA 108 (90 BASE) MCG/ACT IN AERS
108 (90 Base) MCG/ACT | RESPIRATORY_TRACT | Status: DC
Start: 2013-02-27 — End: 2013-08-01

## 2013-02-27 NOTE — Telephone Encounter (Signed)
Peggy Sanders is requesting refill(s) Proventil  Last office visit 10/03/12 (pertaining to medication)  Last refill 10/03/12 (per medication requested)  Next office visit scheduled or attempted No   If "no"; explain reason:

## 2013-06-22 MED ORDER — RABEPRAZOLE SODIUM 20 MG PO TBEC
20 MG | ORAL_TABLET | ORAL | Status: DC
Start: 2013-06-22 — End: 2013-08-01

## 2013-06-22 NOTE — Telephone Encounter (Signed)
done

## 2013-06-22 NOTE — Telephone Encounter (Signed)
Peggy Sanders is requesting refill(s) aciphex  Last office visit 06/21/12 (pertaining to medication)  Last refill 05/29/12 (per medication requested)  Next office visit scheduled or attempted No   If "no"; explain reason:

## 2013-07-30 NOTE — Telephone Encounter (Signed)
Message was that she was panicked because she suddenly started seeing a kaleidoscope out of her eye    ISTH  She was watching TV and began to see a kaleidoscope type image in the right side of her vision  It remains if she shuts and eye and remains if she shuts both eyes  It was fading but not gone while we talked  She does have a history of migraines, but has never had visual aura with them  In fact. She says, she had 6 migraines in the last 8 days- very unusual for her- and had planned to come in to see Dr. Bethena Roys about this    I instructed her to plan to come in to see Dr. Bethena Roys and also to follow-up with her eye doctor  Sounds very much like a migraine aura

## 2013-08-01 MED ORDER — RIZATRIPTAN BENZOATE 10 MG PO TBDP
10 MG | ORAL_TABLET | Freq: Once | ORAL | Status: DC | PRN
Start: 2013-08-01 — End: 2014-11-12

## 2013-08-01 MED ORDER — LORAZEPAM 0.5 MG PO TABS
0.5 MG | ORAL_TABLET | ORAL | Status: DC
Start: 2013-08-01 — End: 2014-05-09

## 2013-08-01 MED ORDER — ALBUTEROL SULFATE HFA 108 (90 BASE) MCG/ACT IN AERS
108 (90 Base) MCG/ACT | RESPIRATORY_TRACT | Status: DC
Start: 2013-08-01 — End: 2015-11-25

## 2013-08-01 MED ORDER — MELOXICAM 15 MG PO TABS
15 MG | ORAL_TABLET | Freq: Every day | ORAL | Status: DC
Start: 2013-08-01 — End: 2014-11-12

## 2013-08-01 MED ORDER — RABEPRAZOLE SODIUM 20 MG PO TBEC
20 MG | ORAL_TABLET | ORAL | Status: DC
Start: 2013-08-01 — End: 2013-11-08

## 2013-08-01 MED ORDER — CYCLOBENZAPRINE HCL 10 MG PO TABS
10 | ORAL_TABLET | ORAL | Status: DC | PRN
Start: 2013-08-01 — End: 2013-08-01

## 2013-08-01 MED ORDER — CYCLOBENZAPRINE HCL 10 MG PO TABS
10 MG | ORAL_TABLET | Freq: Three times a day (TID) | ORAL | Status: DC | PRN
Start: 2013-08-01 — End: 2014-04-01

## 2013-08-01 NOTE — Progress Notes (Signed)
Subjective:      Patient ID: Peggy Sanders is a 37 y.o. female.    HPI  Patient is here with concern of headache which she stated that she has had 8 out of 10 days.  She states that on Sunday she had visual changes and vomiting.  She tried maxalt and muscle relaxants with no help.  She did try mobic which helped. She remembers taking vioxxin the past which helped also.  No known triggers for her headaches.     She needs RF of her meds for asthma.  She occasionally will have some increased sx. She does continue to smoke.    She is also going on a couple of trips soon and requesting meds to help with anxiety with flying.    Review of Systems   Constitutional: Negative for unexpected weight change.   Respiratory: Negative for chest tightness and shortness of breath.    Cardiovascular: Negative for chest pain.   Neurological: Positive for headaches. Negative for dizziness.   Psychiatric/Behavioral: The patient is not nervous/anxious.        Objective:   Physical Exam   Constitutional: She is oriented to person, place, and time. She appears well-developed and well-nourished.   HENT:   Head: Normocephalic and atraumatic.   Eyes: Conjunctivae are normal. Pupils are equal, round, and reactive to light.   Cardiovascular: Normal rate, regular rhythm and normal heart sounds.    Pulmonary/Chest: Effort normal and breath sounds normal.   Abdominal: Soft. Bowel sounds are normal. There is no tenderness.   Neurological: She is alert and oriented to person, place, and time. No cranial nerve deficit. Coordination normal.   Psychiatric: She has a normal mood and affect. Her behavior is normal.   Nursing note and vitals reviewed.      Assessment:/Plan:        1) HA- trial mobic, advised she take regularly for the next 7 days and then d/c, cautioned regarding rebound headaches, can combine with flexeril if needed  2) Anxiety with travel- rx given for xanax  3) RAD- stable RF meds, stressed importance of tobacco cessation

## 2013-08-01 NOTE — Telephone Encounter (Signed)
Katie with [facility] called needing to clarify dose on Flexeril. The script reads prn.

## 2013-08-01 NOTE — Telephone Encounter (Signed)
I sent a new Rx to the pharmacy.

## 2013-08-15 NOTE — Telephone Encounter (Signed)
I spoke with Dr Bethena Roys and she stated to call in 1-2 tablets every 8 hours as needed for anxiety

## 2013-08-15 NOTE — Telephone Encounter (Signed)
Call from pharmacy. They need to know the frequency (how often) the patient is to take the Lorazapam? Directions only indicate to take 1 to 2 tablets for anxiety.    [facility]  [phone]

## 2013-10-29 NOTE — ED Notes (Signed)
Pt reports that her pain is a little bit better. She rates her pain 5/10. She is finished drinking her oral contrast and is ready for her CT scan. VS WNL denies needs at this time.    Saddie Benders, RN  10/29/13 [phone]

## 2013-10-29 NOTE — ED Notes (Signed)
Pt is back from her CT scan.    Saddie Benders, RN  10/29/13 2259

## 2013-10-29 NOTE — Discharge Instructions (Signed)
Constipation: After Your Visit  Your Care Instructions  Constipation means that you have a hard time passing stools (bowel movements). People pass stools from 3 times a day to once every 3 days. What is normal for you may be different. Constipation may occur with pain in the rectum and cramping. The pain may get worse when you try to pass stools. Sometimes there are small amounts of bright red blood on toilet paper or the surface of stools. This is because of enlarged veins near the rectum (hemorrhoids).  A few changes in your diet and lifestyle may help you avoid ongoing constipation. Your doctor may also prescribe medicine to help loosen your stool.  Some medicines can cause constipation. These include pain medicines and antidepressants. Tell your doctor about all the medicines you take. Your doctor may want to make a medicine change to ease your symptoms.  Follow-up care is a key part of your treatment and safety. Be sure to make and go to all appointments, and call your doctor if you are having problems. It's also a good idea to know your test results and keep a list of the medicines you take.  How can you care for yourself at home?   Drink plenty of fluids, enough so that your urine is light yellow or clear like water. If you have kidney, heart, or liver disease and have to limit fluids, talk with your doctor before you increase the amount of fluids you drink.   Include high-fiber foods in your diet each day. These include fruits, vegetables, beans, and whole grains.   Get at least 30 minutes of exercise on most days of the week. Walking is a good choice. You also may want to do other activities, such as running, swimming, cycling, or playing tennis or team sports.   Take a fiber supplement, such as Citrucel or Metamucil, every day. Read and follow all instructions on the label.   Schedule time each day for a bowel movement. A daily routine may help. Take your time having your bowel movement.   Support  your feet with a small step stool when you sit on the toilet. This helps flex your hips and places your pelvis in a squatting position.   Your doctor may recommend an over-the-counter laxative to relieve your constipation. Examples are Milk of Magnesia and MiraLax. Read and follow all instructions on the label. Do not use laxatives on a long-term basis.  When should you call for help?  Call your doctor now or seek immediate medical care if:   You have new or worse belly pain.   You have new or worse nausea or vomiting.   You have blood in your stools.  Watch closely for changes in your health, and be sure to contact your doctor if:   Your constipation is getting worse.   You do not get better as expected.   Where can you learn more?   Go to [email or web address] and sign in to your MyChart account. Enter P343 in the Search Health Information box to learn more about "Constipation: After Your Visit."    If you do not have an account, please click on the "Sign Up Now" link.      2006-2014 [employer]. Care instructions adapted under license by [facility]. This care instruction is for use with your licensed healthcare professional. If you have questions about a medical condition or this instruction, always ask your healthcare professional. [employer] disclaims any warranty or liability for your   use of this information.  Content Version: 10.3.368381; Current as of: March 09, 2013                Abdominal Pain: After Your Visit  Your Care Instructions     Abdominal pain has many possible causes. Some aren't serious and get better on their own in a few days. Others need more testing and treatment. If your pain continues or gets worse, you need to be rechecked and may need more tests to find out what is wrong. You may need surgery to correct the problem.  Don't ignore new symptoms, such as fever, nausea and vomiting, urination problems, pain that gets worse, and  dizziness. These may be signs of a more serious problem.  Your doctor may have recommended a follow-up visit in the next 8 to 12 hours. If you are not getting better, you may need more tests or treatment.  The doctor has checked you carefully, but problems can develop later. If you notice any problems or new symptoms, get medical treatment right away.  Follow-up care is a key part of your treatment and safety. Be sure to make and go to all appointments, and call your doctor if you are having problems. It's also a good idea to know your test results and keep a list of the medicines you take.  How can you care for yourself at home?   Rest until you feel better.   To prevent dehydration, drink plenty of fluids, enough so that your urine is light yellow or clear like water. Choose water and other caffeine-free clear liquids until you feel better. If you have kidney, heart, or liver disease and have to limit fluids, talk with your doctor before you increase the amount of fluids you drink.   If your stomach is upset, eat mild foods, such as rice, dry toast or crackers, bananas, and applesauce. Try eating several small meals instead of two or three large ones.   Wait until 48 hours after all symptoms have gone away before you have spicy foods, alcohol, and drinks that contain caffeine.   Do not eat foods that are high in fat.   Avoid anti-inflammatory medicines such as aspirin, ibuprofen (Advil, Motrin), and naproxen (Aleve). These can cause stomach upset. Talk to your doctor if you take daily aspirin for another health problem.  When should you call for help?  Call 911 anytime you think you may need emergency care. For example, call if:   You passed out (lost consciousness).   You pass maroon or very bloody stools.   You vomit blood or what looks like coffee grounds.   You have new, severe belly pain.  Call your doctor now or seek immediate medical care if:   Your pain gets worse, especially if it becomes  focused in one area of your belly.   You have a new or higher fever.   Your stools are black and look like tar, or they have streaks of blood.   You have unexpected vaginal bleeding.   You have symptoms of a urinary tract infection. These may include:   Pain when you urinate.   Urinating more often than usual.   Blood in your urine.   You are dizzy or lightheaded, or you feel like you may faint.  Watch closely for changes in your health, and be sure to contact your doctor if:   You are not getting better after 1 day (24 hours).   Where can you learn   more?   Go to [email or web address] and sign in to your MyChart account. Enter E907 in the Search Health Information box to learn more about "Abdominal Pain: After Your Visit."    If you do not have an account, please click on the "Sign Up Now" link.      2006-2014 [employer]. Care instructions adapted under license by [facility]. This care instruction is for use with your licensed healthcare professional. If you have questions about a medical condition or this instruction, always ask your healthcare professional. [employer] disclaims any warranty or liability for your use of this information.  Content Version: 10.3.368381; Current as of: February 21, 2013

## 2013-10-29 NOTE — ED Notes (Addendum)
Pt requesting more pain medication. She reports that "all the moving around" made her pain worse. Dr. Raye SorrowEippert has been notified.     Harriet PhoShannon Llewyn Heap, RN  10/29/13 2300    Harriet PhoShannon Gao Mitnick, RN  10/29/13 [phone]

## 2013-10-29 NOTE — ED Provider Notes (Signed)
Patient is a 38 year old female who presents with constipation and abdominal pain. She reports that she hasn't had a BM for 16 days, and that she has been having LUQ abdominal pain for the past 10 days. Her abdominal pain has worsened today. She states that she feels full and has difficulty eating. Husband reports that she has a history of stomach acid problems. She reports nausea and a low grade fever but denies vomiting, dysuria, hematuria. She states her migraine began 2 days ago. She has a history of chronic migraines and is scheduled to see Dr. Dara Lords, neurologist.     Patient is a 38 y.o. female presenting with abdominal pain and constipation. The history is provided by the patient. No language interpreter was used.   Abdominal Pain  Pain location:  LUQ  Pain radiates to:  Does not radiate  Pain severity:  Severe  Duration:  10 days  Timing:  Constant  Progression:  Worsening  Chronicity:  New  Relieved by:  None tried  Ineffective treatments:  None tried  Associated symptoms: constipation, fever and nausea    Associated symptoms: no dysuria, no hematuria and no vomiting    Constipation  Severity:  Moderate  Time since last bowel movement:  16 days  Timing:  Constant  Progression:  Unchanged  Chronicity:  New  Stool description:  None produced  Relieved by:  Nothing  Ineffective treatments:  Miralax  Associated symptoms: abdominal pain, fever and nausea    Associated symptoms: no dysuria, no urinary retention and no vomiting        Review of Systems   Constitutional: Positive for fever.   Gastrointestinal: Positive for nausea, abdominal pain and constipation. Negative for vomiting.   Genitourinary: Negative for dysuria and hematuria.   Skin: Negative for rash and wound.   All other systems reviewed and are negative.        ALL OTHER SYSTEMS REVIEWED AND ARE NEGATIVE    PAST MEDICAL HISTORY   has a past medical history of GERD (gastroesophageal reflux disease); PCOS (polycystic ovarian syndrome); Cervical  intraepithelial neoplasia grade III with severe dysplasia (2008); History of ovarian cyst (2008); History of endometriosis (2008); and History of trichomoniasis (2008).    PAST SURGICAL HISTORY   has past surgical history that includes Tubal ligation; Endometrial ablation (2008); Adenoidectomy; Inner ear surgery; Cervix surgery; and Dilation & curettage (1993).    SOCIAL HISTORY   reports that she has been smoking Cigarettes.  She has been smoking about 0.50 packs per day. She has never used smokeless tobacco. She reports that she drinks alcohol. She reports that she does not use illicit drugs.    MEDICATIONS  Prior to Admission medications    Medication Sig Start Date End Date Taking? Authorizing Provider   Ranitidine HCl (ZANTAC 75 PO) Take  by mouth.   Yes Historical Provider, MD   rizatriptan (MAXALT-MLT) 10 MG disintegrating tablet Take 10 mg by mouth once as needed for Migraine. May repeat in 2 hours if needed 09/18/11 10/29/13 Yes Serita Grammes, MD   Sodium Phosphates (FLEET) 7-19 GM/118ML Place 1 enema rectally once as needed.   Yes Jeri Lager, PA-C   RABEprazole (ACIPHEX) 20 MG tablet TAKE ONE TABLET BY MOUTH EVERY DAY 08/01/13  Yes Serita Grammes, MD   albuterol (PROVENTIL HFA) 108 (90 BASE) MCG/ACT inhaler INHALE 2 PUFFS BY MOUTH INTO THE LUNGS EVERY 6 HOURS AS NEEDED FOR WHEEZING 08/01/13  Yes Serita Grammes, MD  cyclobenzaprine (FLEXERIL) 10 MG tablet Take 1 tablet by mouth 3 times daily as needed. 08/01/13  Yes Serita Grammes, MD   albuterol (PROVENTIL) (5 MG/ML) 0.5% nebulizer solution Take 1 mL by nebulization 4 times daily as needed for Wheezing. 10/03/12  Yes Serita Grammes, MD   meloxicam (MOBIC) 15 MG tablet Take 1 tablet by mouth daily. 08/01/13   Serita Grammes, MD   rizatriptan (MAXALT-MLT) 10 MG disintegrating tablet Take 1 tablet by mouth once as needed for Migraine for up to 1 dose. May repeat in 2 hours if needed 08/01/13 08/01/13  Serita Grammes, MD   LORazepam  (ATIVAN) 0.5 MG tablet 1-2 TABS AS NEEDED FOR ANXIETY 08/01/13   Serita Grammes, MD   meloxicam (MOBIC) 15 MG tablet Take 1 tablet by mouth daily for 15 doses. 11/08/12 11/23/12  Sharman Crate, MD   rizatriptan (MAXALT-MLT) 10 MG disintegrating tablet Take 1 tablet by mouth once as needed for Migraine for 1 dose. May repeat in 2 hours if needed 09/22/10 09/17/11  Serita Grammes, MD   ibuprofen (IBU) 800 MG tablet Take 1 tablet by mouth every 8 hours as needed for Pain. 03/25/10 03/25/11  Arvella Nigh, PA       ALLERGIES  Allergies   Allergen Reactions   ??? Esomeprazole Magnesium Swelling   ??? Lansoprazole Swelling   ??? Protonix [Pantoprazole Sodium] Swelling   ??? Tylenol With Codeine #3 [Acetaminophen-Codeine]      Pt states she passed out when she took the medication.         Triage Summary nursing notes reviewed:    ED Triage Vitals   Enc Vitals Group      BP 10/29/13 2032 134/80 mmHg      Pulse 10/29/13 2032 114      Resp 10/29/13 2032 16      Temp 10/29/13 2032 97.7 ??F (36.5 ??C)      Temp Source 10/29/13 2032 Oral      SpO2 10/29/13 2032 100 %      Weight 10/29/13 2032 280 lb (127.007 kg)      Height 10/29/13 2032 5' 4"$  (1.626 m)      Head Cir --       Peak Flow --       Pain Score --       Pain Loc --       Pain Edu? --       Excl. in [place]? --      Physical Exam   Constitutional: She is oriented to person, place, and time.   Abdominal: Normal appearance. She exhibits no distension and no mass. Bowel sounds are decreased. There is no hepatosplenomegaly. There is tenderness in the left lower quadrant. There is no rigidity, no rebound, no guarding, no CVA tenderness, no tenderness at McBurney's point and negative Murphy's sign.   Neurological: She is alert and oriented to person, place, and time. She displays no atrophy and no tremor. No cranial nerve deficit or sensory deficit. She exhibits normal muscle tone. She displays no seizure activity. Coordination normal. GCS eye subscore is 4. GCS verbal subscore is  5. GCS motor subscore is 6.     Constitutional:  Patient is alert, healthy, well nourished, well developed and cooperative.  She rates the pain level as  mild. She appears mildly symptomatic and  non-toxic.  HENT:   Head: Normocephalic and atraumatic.   Right Ear: External ear normal.   Left Ear: External  ear normal.   Nose: Nose normal.   Mouth/Throat: Oropharynx is clear and mucous membranes look well hydrated .   Eyes: Conjunctivae and extraocular motions are normal. Right eye exhibits no discharge. Left eye exhibits no discharge. No scleral icterus.   Neck: Normal range of motion. Neck supple.   Cardiovascular: Regular rhythm, normal heart sounds and intact distal pulses.  Exam reveals no gallop and no friction rub.  No murmur heard.  Pulmonary/Chest: Effort normal and breath sounds normal. No stridor. No respiratory distress.  no wheezes.  no rhonchi.  no rales.   Abdominal:   Musculoskeletal: Normal range of motion of extremeties. No peripheral edema   Neurological:   Skin: Skin is warm and dry. No rash noted.   Psychiatric: Patient has a anxious affect.  Speech is normal.    TESTS      I, Lynetta Mare MD, have independently reviewed the following labs:  Results for orders placed during the hospital encounter of 10/29/13   CBC WITH AUTO DIFFERENTIAL       Result Value Range    WBC 11.1 (*) 4.0 - 11.0 K/uL    RBC 4.56  4.00 - 5.20 M/uL    Hemoglobin 14.3  12.0 - 16.0 g/dL    Hematocrit 41.0  36.0 - 48.0 %    MCV 89.8  80.0 - 100.0 fL    MCH 31.4  26.0 - 34.0 pg    MCHC 34.9  31.0 - 36.0 g/dL    RDW 12.7  12.4 - 15.4 %    Platelets 356  135 - 450 K/uL    MPV 6.9  5.0 - 10.5 fL    Neutrophils Relative 62.9      Lymphocytes Relative 28.4      Monocytes Relative 6.5      Eosinophils Relative Percent 1.5      Basophils Relative 0.7      Neutrophils Absolute 7.0  1.7 - 7.7 K/uL    Lymphocytes Absolute 3.2  1.0 - 5.1 K/uL    Monocytes Absolute 0.7  0.0 - 1.3 K/uL    Eosinophils Absolute 0.2  0.0 - 0.6 K/uL     Basophils Absolute 0.1  0.0 - 0.2 K/uL   COMPREHENSIVE METABOLIC PANEL       Result Value Range    Sodium 138  136 - 145 mmol/L    Potassium 3.8  3.5 - 5.1 mmol/L    Chloride 101  99 - 110 mmol/L    CO2 23  21 - 32 mmol/L    Anion Gap 14  3 - 16    Glucose 89  70 - 99 mg/dL    BUN 8  7 - 20 mg/dL    CREATININE 0.6  0.6 - 1.1 mg/dL    GFR Non-African American >60  >60    GFR African American >60  >60    Calcium 9.2  8.3 - 10.6 mg/dL    Total Protein 7.4  6.4 - 8.2 g/dL    Alb 3.9  3.4 - 5.0 g/dL    Albumin/Globulin Ratio 1.1  1.1 - 2.2    Total Bilirubin 0.2  0.0 - 1.0 mg/dL    Alkaline Phosphatase 62  40 - 129 U/L    ALT 15  10 - 40 U/L    AST 16  15 - 37 U/L    Globulin 3.5     LIPASE       Result Value Range    Lipase  30.0  13.0 - 60.0 U/L   URINALYSIS WITH MICROSCOPIC       Result Value Range    Color, UA Yellow  Straw/Yellow    Clarity, UA Clear  Clear    Glucose, Ur Negative  Negative mg/dL    Bilirubin Urine Negative  Negative    Ketones, Urine Negative  Negative mg/dL    Specific Gravity, UA <=1.005  1.005-1.030    Blood, Urine TRACE-LYSED (*) Negative    pH, UA 6.0  5.0-8.0    Protein, UA Negative  Negative mg/dL    Urobilinogen, Urine 0.2  <2.0 E.U./dL    Nitrite, Urine Negative  Negative    Leukocyte Esterase, Urine Negative  Negative    Microscopic Examination YES      Casts 0-1 Coarse Gran (*)     Mucus, UA 1+ (*)     WBC, UA 0-2  0 - 5 /HPF    RBC, UA 0-2  0 - 2 /HPF    Epi Cells 3-5      Bacteria, UA 1+ (*)        CT SCAN results as read by the radiologist: Ct Abdomen Pelvis W Iv Contrast    10/29/2013   CT Abdomen/Pelvis with contrast: 10/29/2013.   Comparison: None.   Clinical History: Left upper quadrant pain.   Technical Factors: Contiguous 80m axial images were obtained of the abdomen and pelvis from the lung bases through the pubic symphysis following the administration of IV contrast.  Oral contrast was administered.  Coronal 2D reformatted images were also obtained.   Findings:   The lung bases are  clear. Within the abdomen, the liver is diffusely low in attenuation, consistent with fatty infiltration. The liver is otherwise unremarkable. The spleen, pancreas, gallbladder, adrenal glands, and kidneys are normal.   Evaluation of the hollow GI tract demonstrates no evidence of focal bowel wall thickening, dilatation, or bowel obstruction. The appendix is visualized and is normal. No intraperitoneal free air or free fluid is identified. No pathologic intra-abdominal lymphadenopathy is seen. No significant abdominal wall hernia is evident.   Within the pelvis, the urinary bladder, uterus, and bilateral adnexa are normal. No free pelvic fluid or pathologic pelvic lymphadenopathy is identified. Bone windows demonstrate degenerative change throughout the spine without evidence of an osteolytic or osteoblastic lesion.      10/29/2013   IMPRESSION:   1. No acute intra-abdominal or intrapelvic process.   2. Hepatic steatosis.   3. Normal appendix.        DIAGNOSIS:  The primary encounter diagnosis was Constipation. Diagnoses of PCOS (polycystic ovarian syndrome) and LLQ abdominal pain were also pertinent to this visit.      Discharge Medication List as of 10/29/2013 11:59 PM      START taking these medications    Details   Sodium Phosphates (FLEET) 7-19 GM/118ML Place 1 enema rectally once as needed., Disp-1 Bottle, R-0             I estimate there is LOW risk for ACUTE APPENDICITIS, BOWEL OBSTRUCTION, CHOLECYSTITIS, DIVERTICULITIS, INCARCERATED HERNIA, PANCREATITIS, PERFORATED BOWEL , ULCER, MESENTERIC ISCHEMIA, MYOCARDIAL INFARCTION, INTRA-ABDOMINAL ORGAN INJURY,  OR ABDOMINAL AORTIC ANEURSYM, and  thus I consider the discharge disposition reasonable. Also, there is no evidence or peritonitis, sepsis, or toxicity.  KVivien Rotaand I have discussed the diagnosis and risks, and we agree with discharging home to follow-up with their primary doctor. We also discussed returning to the Emergency Department immediately  if new  or worsening symptoms occur. We have discussed the symptoms which are most concerning (e.g., bloody stool, fever, changing or worsening pain, vomiting) that necessitate immediate return          Discussed results, diagnosis and plan with patient and husband.  Questions addressed.  Disposition agreed upon.  Diagnosis Specific discharge instructions explained, follow up and time frame for that follow up, and  reasons to return to the emergency department.    This document serves as a record of the services and decisions personally performed by myself, Lynetta Mare, MD. It was created on my behalf by Sherilyn Banker, a trained medical scribe. The creation of this document is based on my statements to the medical scribe. The creation of this document is based on my statements to the medical scribe.    This dictation was generated by voice recognition computer software.  Although all attempts are made to edit the dictation for accuracy, there may be errors in the transcription that are not intended.    Sherilyn Banker, 11/01/2013 2:10 PM scribing for and in the presence of Lynetta Mare, MD.    ???I personally performed the services described in the documentation, reviewed and edited the documentation, which was dictated to the scribe in my presence, and it accurately records my words and actions.??? Lynetta Mare M.D. 10/29/2013 8:53 PM      Procedures    MDM  Number of Diagnoses or Management Options  Constipation: new and requires workup  LLQ abdominal pain: new and requires workup  PCOS (polycystic ovarian syndrome): new and requires workup     Amount and/or Complexity of Data Reviewed  Clinical lab tests: ordered and reviewed  Tests in the radiology section of CPT??: ordered and reviewed  Independent visualization of images, tracings, or specimens: yes    Risk of Complications, Morbidity, and/or Mortality  Presenting problems: high  Diagnostic procedures: high  Management options: high    Patient Progress  Patient  progress: stable              Lynetta Mare, MD  11/01/13 1411

## 2013-10-30 ENCOUNTER — Inpatient Hospital Stay: Admit: 2013-10-30 | Discharge: 2013-10-30 | Attending: Emergency Medicine

## 2013-10-30 LAB — CBC WITH AUTO DIFFERENTIAL
Basophils %: 0.7 %
Basophils Absolute: 0.1 10*3/uL (ref 0.0–0.2)
Eosinophils %: 1.5 %
Eosinophils Absolute: 0.2 10*3/uL (ref 0.0–0.6)
Hematocrit: 41 % (ref 36.0–48.0)
Hemoglobin: 14.3 g/dL (ref 12.0–16.0)
Lymphocytes %: 28.4 %
Lymphocytes Absolute: 3.2 10*3/uL (ref 1.0–5.1)
MCH: 31.4 pg (ref 26.0–34.0)
MCHC: 34.9 g/dL (ref 31.0–36.0)
MCV: 89.8 fL (ref 80.0–100.0)
MPV: 6.9 fL (ref 5.0–10.5)
Monocytes %: 6.5 %
Monocytes Absolute: 0.7 10*3/uL (ref 0.0–1.3)
Neutrophils %: 62.9 %
Neutrophils Absolute: 7 10*3/uL (ref 1.7–7.7)
Platelets: 356 10*3/uL (ref 135–450)
RBC: 4.56 M/uL (ref 4.00–5.20)
RDW: 12.7 % (ref 12.4–15.4)
WBC: 11.1 10*3/uL — ABNORMAL HIGH (ref 4.0–11.0)

## 2013-10-30 LAB — URINALYSIS WITH MICROSCOPIC
Bilirubin Urine: NEGATIVE
Glucose, Ur: NEGATIVE mg/dL
Ketones, Urine: NEGATIVE mg/dL
Leukocyte Esterase, Urine: NEGATIVE
Nitrite, Urine: NEGATIVE
Protein, UA: NEGATIVE mg/dL
Specific Gravity, UA: <=1.005 (ref 1.005–1.030)
Urobilinogen, Urine: 0.2 E.U./dL (ref <2.0)
pH, UA: 6 (ref 5.0–8.0)

## 2013-10-30 LAB — COMPREHENSIVE METABOLIC PANEL
ALT: 15 U/L (ref 10–40)
AST: 16 U/L (ref 15–37)
Albumin/Globulin Ratio: 1.1 (ref 1.1–2.2)
Albumin: 3.9 g/dL (ref 3.4–5.0)
Alkaline Phosphatase: 62 U/L (ref 40–129)
Anion Gap: 14 (ref 3–16)
BUN: 8 mg/dL (ref 7–20)
CO2: 23 mmol/L (ref 21–32)
Calcium: 9.2 mg/dL (ref 8.3–10.6)
Chloride: 101 mmol/L (ref 99–110)
Creatinine: 0.6 mg/dL (ref 0.6–1.1)
GFR African American: >60 (ref >60)
GFR Non-African American: >60 (ref >60)
Globulin: 3.5 g/dL
Glucose: 89 mg/dL (ref 70–99)
Potassium: 3.8 mmol/L (ref 3.5–5.1)
Sodium: 138 mmol/L (ref 136–145)
Total Bilirubin: 0.2 mg/dL (ref 0.0–1.0)
Total Protein: 7.4 g/dL (ref 6.4–8.2)

## 2013-10-30 LAB — LIPASE: Lipase: 30 U/L (ref 13.0–60.0)

## 2013-10-30 MED ORDER — FLEET ENEMA 7-19 GM/118ML RE ENEM
7-19 GM/118ML | Freq: Once | RECTAL | Status: DC | PRN
Start: 2013-10-30 — End: 2013-11-07

## 2013-10-30 MED ADMIN — HYDROmorphone HCl PF (DILAUDID) injection SOLN 1 mg: 1 mg | INTRAVENOUS | @ 04:00:00 | NDC 00409128331

## 2013-10-30 MED ADMIN — iohexol (OMNIPAQUE 240) injection 20 mL: 20 mL | ORAL | @ 03:00:00 | NDC 00407141220

## 2013-10-30 MED ADMIN — metoclopramide (REGLAN) injection 10 mg: 10 mg | INTRAVENOUS | @ 03:00:00 | NDC 00409341401

## 2013-10-30 MED ADMIN — 0.9 % sodium chloride bolus: 1000 mL | INTRAVENOUS | @ 03:00:00 | NDC 00338004904

## 2013-10-30 MED ADMIN — ondansetron (ZOFRAN) injection 4 mg: 4 mg | INTRAVENOUS | @ 03:00:00 | NDC 23155037831

## 2013-10-30 MED ADMIN — ioversol (OPTIRAY) 68 % injection 100 mL: 100 mL | INTRAVENOUS | @ 04:00:00 | NDC 00019132304

## 2013-10-30 MED ADMIN — diphenhydrAMINE (BENADRYL) injection 50 mg: 50 mg | INTRAVENOUS | @ 03:00:00 | NDC 00641037621

## 2013-10-30 MED ADMIN — ketorolac (TORADOL) injection 30 mg: 30 mg | INTRAVENOUS | @ 03:00:00 | NDC 63323016201

## 2013-10-30 MED FILL — MINERAL OIL PO OIL: ORAL | Qty: 120

## 2013-10-30 MED FILL — METOCLOPRAMIDE HCL 5 MG/ML IJ SOLN: 5 MG/ML | INTRAMUSCULAR | Qty: 2

## 2013-10-30 MED FILL — KETOROLAC TROMETHAMINE 30 MG/ML IJ SOLN: 30 MG/ML | INTRAMUSCULAR | Qty: 1

## 2013-10-30 MED FILL — SORBITOL 70 % SOLN: 70 % | Qty: 30

## 2013-10-30 MED FILL — HYDROMORPHONE HCL PF 1 MG/ML IJ SOLN: 1 MG/ML | INTRAMUSCULAR | Qty: 1

## 2013-10-30 MED FILL — GLYCERIN LIQD: Qty: 120

## 2013-10-30 MED FILL — ONDANSETRON HCL 4 MG/2ML IJ SOLN: 4 MG/2ML | INTRAMUSCULAR | Qty: 2

## 2013-10-30 MED FILL — DIPHENHYDRAMINE HCL 50 MG/ML IJ SOLN: 50 MG/ML | INTRAMUSCULAR | Qty: 1

## 2013-10-30 MED FILL — SORBITOL 70 % SOLN: 70 % | Qty: 100

## 2013-10-30 NOTE — ED Notes (Signed)
Discussed d/c prescription, reasons to return and importance of f/u. Pt verbalizes understanding and d/c'd home in stable condition    Saddie Benders, RN  10/30/13 [phone]

## 2013-10-30 NOTE — ED Notes (Signed)
Went in to give the patient a smog enema. She reports that she was told by the PA-C that she would be given one here to take home. Pt informed that we cannot dispense medication. Theresia Lo, PA-c was called by me and asked if he told her that I would give her a smog enema to take home. He reports that he wrote a prescription for one.     Saddie Benders, RN  10/30/13 [phone]

## 2013-11-07 MED ORDER — METFORMIN HCL ER 500 MG PO TB24
500 MG | ORAL_TABLET | Freq: Every day | ORAL | Status: DC
Start: 2013-11-07 — End: 2014-02-07

## 2013-11-07 MED ORDER — POLYETHYLENE GLYCOL 3350 17 GM/SCOOP PO POWD
17 GM/SCOOP | Freq: Every day | ORAL | Status: AC
Start: 2013-11-07 — End: 2013-12-07

## 2013-11-07 NOTE — Progress Notes (Signed)
Subjective:      Patient ID: Peggy Sanders is a 38 y.o. female.    HPI   Patient was seen in ER last week after only going to the bathroom twice in 2 days.  SHe had a CT scan which did show fatty liver.  She is scheduled for a colonoscopy and EGD on Monday due to persistent LUQ pain.  She also has persistent nausea after eating.  No vomiting.  She is now having loose stools.  She did have fleets enema in ER but not taking any medications for her bowels.    She has diagnosis of PCOS and had been prescribed metformin but stopped taking after a couple of weeks due to stomach upset.      Review of Systems   Constitutional: Negative for unexpected weight change.   Respiratory: Negative for chest tightness and shortness of breath.    Cardiovascular: Negative for chest pain.   Neurological: Negative for dizziness.   Psychiatric/Behavioral: The patient is not nervous/anxious.        Objective:   Physical Exam   Constitutional: She is oriented to person, place, and time. She appears well-developed and well-nourished.   HENT:   Head: Normocephalic and atraumatic.   Eyes: Conjunctivae are normal. Pupils are equal, round, and reactive to light.   Cardiovascular: Normal rate, regular rhythm and normal heart sounds.    Pulmonary/Chest: Effort normal. She has wheezes.   Abdominal: Soft. Bowel sounds are normal. There is tenderness (LUQ ttp).   Neurological: She is alert and oriented to person, place, and time.   Psychiatric: She has a normal mood and affect. Her behavior is normal.   Nursing note and vitals reviewed.      Assessment:/Plan:        1) LUQ discomfort- likely related to constipation, start miralax, colonoscopy and EGD as scheduled  2) Fatty liver- stressed importance of weight loss and diet low in carbs and saturated fats, f/u 3 mo  3) PCOS- restart metformin after abd upset resolves

## 2013-11-08 MED ORDER — RABEPRAZOLE SODIUM 20 MG PO TBEC
20 MG | ORAL_TABLET | Freq: Every day | ORAL | Status: DC
Start: 2013-11-08 — End: 2014-09-04

## 2013-11-08 MED ORDER — FLUTICASONE PROPIONATE 50 MCG/ACT NA SUSP
50 MCG/ACT | Freq: Every day | NASAL | Status: DC
Start: 2013-11-08 — End: 2014-02-07

## 2013-11-08 NOTE — Telephone Encounter (Signed)
I sent the Rx to the pharmacy for higher dose of aciphex and flonase

## 2013-11-08 NOTE — Telephone Encounter (Signed)
Katie with [facility] called for clarification on the Aciphex and Flonase directions.    The Aciphex - is patient to take 2 tabs daily or 1 tab daily (two directions on script)  Flonase - Need to know how many times a day and how many puffs in each nostril.    They can be reached at [phone]

## 2013-11-08 NOTE — Telephone Encounter (Signed)
Patient was here yesterday and Rabeprazole was to have been increased to 40 mg and called into [facility] listed.  It is not there.  She was also to be given a nasal spray that we had samples of.  This was not given.  Please call her on what can be done for these 2 request.

## 2013-11-08 NOTE — Telephone Encounter (Signed)
I spoke with the patient and she said you discussed sending the new script to the pharmacy her insurance may not cover the 2 tablets but she said we could send it and see, also you mentioned the nasal spray you may of had a sample of?  For her nasal being swollen.  I will call her if you want to send these to the pharmacy for her.

## 2013-11-08 NOTE — Telephone Encounter (Signed)
I spoke with the pharmacy and gave the correct directions

## 2013-11-12 MED FILL — DEMEROL 50 MG/ML IJ SOLN: 50 MG/ML | INTRAMUSCULAR | Qty: 1

## 2013-11-12 MED FILL — MIDAZOLAM HCL 5 MG/5ML IJ SOLN: 5 MG/ML | INTRAMUSCULAR | Qty: 5

## 2013-11-12 NOTE — Discharge Instructions (Signed)
Endoscopy Discharge Instructions  .Dr. Layla BarterGoldberg [phone]    Call the physician that did your procedure for any questions or concerns.    You may be drowsy or lightheaded after receiving sedation.  DO NOT operate  a vehicle (automobile, bicycle, motorcycle, machinery, or power tools), no  alcoholic beverages, and do not make any important decisions today.                 Plan on bed rest or quiet relaxation today.  Resume normal activities in the morning.     Resume normal activity tomorrow unless otherwise advised by your physician.            Eat a light first meal, avoiding spicy and fatty foods, then resume normal diet unless  you are told otherwise by your physician.    If the intravenous medication site is painful, apply warm compresses on the site until the soreness is relieved and elevate the arm above the heart. Call your physician if no improvement  in 2-3 days.       POSSIBLE SYMPTOMS TO WATCH:     1. fever (greater than 100) 5. increased abdominal bloating   2. severe pain   6. excessive bleeding   3. nausea and vomiting  7. chest pain   4. chills    8. shortness of breath       Notify your physician if these problems occur     Expected as normal and remedies:  1. Sore throat: use over the counter throat lozenges or gargle with warm salt water.  2. Redness or soreness at the IV site: apply warm compress  3. Gaseous discomfort: belching or passing flatus (gas).                    Call for biopsy results in :________________________                    Call for follow-up appointment on:______________________                Educational information given on:_______________________                   We want to thank you for choosing the [facility] as your health care provider. We hope that you received excellent care while you were here. You may receive a survey in the mail regarding your care. We would appreciate you taking a  few minutes of your time to complete this survey. Again, thank you  for choosing the [facility].                   Please review these discharge instructions this evening or tomorrow for               information you may have forgotten.

## 2013-11-12 NOTE — Plan of Care (Addendum)
ADMISSION PRE-PROCEDURE INTRA-PROCEDURE POST-PROCEDURE: RECOVERY/ DISCHARGE   ASSESSMENT &  EVALUATION CONSULTS _0  Verify patient identification, allergies, vital signs, NPO, IV, & SPO2  _1  Complete the ALDRETE SCORE  _2  Consent form to treat signed  _3  History and Physical _4  Reassess patient after pre- procedure medication given  _5  GI physician evaluates pt  _6  Verify patient's name, allergies _7  Continuous monitoring of vital  signs, SPO2, LOC  _8  Emotional status  _9  Patient comfort level _10  Total system admission assessment with appropriate intervention  _11  Pain evaluation and management  _12  Discharge criteria met  _13  Discharge assessment with appropriate intervention  _14  Compare with pre-procedure status  _15  Discharge by appropriate physician   DIAGNOSTIC / TESTS _16   Lab work ordered  _17   Obtain and attach lab work to patient's chart  _18   Report abnormals and F/U with physician _19  Assure needed test results are present _20  Diagnostic testing as indicated  _21  Obtained specimens sent to lab _22  Diagnostic testing as indicated    _23  Assess patient's gag reflex    MEDICATIONS _24   Conscious sedation medications  explained to patient  _25   Start IV per physician's order  and/or protocol  _26   Verify compliance of the colon prep  _27   Pre-procedure med. as ordered  _28  Verify compliance of colon prep.   _29  Re-check IV access _30  Assist with administration of IV conscious sedation medication  _31  Start O2  per  nasal cannula, if needed   _32  IV fluids as indicated/ordered  _33  Administration of medications as ordered  _34  Medications as prescribed  _35  D/C O2 therapy as ordered   PROCEDURE/TREATMENT _36  Specific order by GI physician  _37  Specific procedure as scheduled  _38   Verify procedure as ordered _39  Time out/procedure verification checklist complete. _40  EGD/Colonoscopy and related procedures  _41  Assist physician with the procedure _42  Treatment as indicated   NUTRITION / DIET _43  NPO  after midnight, as ordered _44  Verify NPO status _45  IV fluids as support _46  Clear liquids and/or ice chips as ordered  _47  Tolerating clear liquids  _48  Special diet as ordered  _49  D/C IV fluids   ACTIVITY  _50  Assess level of function  _51   Specified by physician  _52   Activity as tolerated _53  Position on left side _54  Position on left side and reposition patient as physician ordered _55  Gradually elevate HOB to fowler???s position  _56  Position changes as patient tolerated  _57  Ambulate as pre-procedure   PATIENT / S.O. EDUCATION _58  Pre, Intra Post-procedure  teaching appropriate to procedure  _59  Conscious Sedation Teaching  _60  Pain Management - instructed _61  Encourage questions  _62  Clarify any concerns _63  Safety devices maintain to  prevent patient injury  _64  Assist and support patient  _65  Observe standard precautions _66  Physician confers with the family/S.O.  _67  Short visit from family in [place] area  _68  Physician specific post-procedure orders  <[id number]>_2<\/[id number]>_73  S/S complications with proper _70 F/U; office visits F/U  _71  Review discharge instructions, medicine to family /S.O.  _72  Medication/ special diet information given to family/ S.O.  _73   Copy of discharge instructions givn to family/S.O.   OUTCOME PLANNING  DISCHARGE PLAN _74  Patient/S.O. will verbalize understanding of admission procedure & expectations of outcome in realistic terms   _75  Patient verbalize the role of family/S.O. in plan of care  _76  Patient will have designated responsible person available for discharge. _77  Patient will demonstrate an  understanding of the planned  procedure and its related procedures  and conscious sedation [x]  Patient will:  - receive services according to the           standards of care  - receive standards for conscious       sedation  - remain free of injury [x]  Patient will:  - have stable vital signs based on Aldrete Score   - be pain free or tolerable, have no bleeding  - have minimal abdominal distention   -   return to pre-procedure level of orientation   -  tolerate fluid with no nausea and vomiting  -  ambulate as pre-procedure ADL   - verbalize understanding of the discharge instructions     Peggy Sanders  12:20 PM     Ernest Mallick Harlingen  2:14 PM

## 2013-11-12 NOTE — Brief Op Note (Signed)
Brief Postoperative Note    Peggy KussmaulKimberley A Sanders  Date of Birth:  1975/12/14  [id number]    Pre-operative Diagnosis: GERD, recurrent nausea    Post-operative Diagnosis: Normal EGD    Procedure: EGD    Anesthesia: Benzocaine spray, 50 mg IV demerol, 7 mg IV versed    Surgeons/Assistants: Baxter KailStephen J Demarquis Osley MD    Estimated Blood Loss: None    Complications: None    Specimens: Were Not Obtained    Findings: Normal EGD    Electronically signed by Maryan PulsStephen Rashi Granier, MD on 11/12/2013 at 2:14 PM

## 2013-11-12 NOTE — Brief Op Note (Signed)
Brief Postoperative Note    Ara KussmaulKimberley A Loja  Date of Birth:  November 19, 1975  [id number]    Pre-operative Diagnosis: Unexplained abdominal pain; constipation    Post-operative Diagnosis: Normal colonoscopy    Procedure: Colonoscopy    Anesthesia: Conscious sedation (see prior endoscopy report)    Surgeons/Assistants: Baxter KailStephen J Abbye Lao MD    Estimated Blood Loss: None    Complications: None    Specimen: Was Not Obtained    Findings: Normal colonoscopy    Electronically signed by Maryan PulsStephen Eviana Sibilia, MD on 11/12/2013 at 2:15 PM

## 2013-11-12 NOTE — Op Note (Signed)
PATIENT NAME:                 PA #:            MR #Peggy Sanders:                 Wildeman, Shalee           [id number]       [id number]            SURGEON:                              SURG DATE:  DIS DATE:          Baxter KailSTEPHEN J Tanda Morrissey, MD                11/12/2013  11/12/2013         PRIMARY CARE PHYSICIAN:               REFERRING PHYSICIAN:            Darrel ReachKATHRYN MARGARET PEEDEN, MD                                           DATE OF BIRTH:   AGE:           PATIENT TYPE:     RM #:              22-Feb-1976       37             [facility]                                     PROCEDURE:  Esophagogastroduodenoscopy.     PREOPERATIVE DIAGNOSIS:  Gastroesophageal reflux and persistent nausea.     POSTOPERATIVE DIAGNOSIS:  Normal examination.     ANESTHESIA:  Benzocaine spray, 50 mg IV Demerol, 7 mg IV Versed.     COMPLICATIONS:  None.     PROCEDURE IN DETAIL:  The Olympus video panendoscope was intubated  uneventfully into the esophagus and advanced uneventfully to the  gastroesophageal junction, without noting any esophageal abnormality.   The  stomach was intubated and carefully inspected, and the cardia and incisura  well visualized when the instrument was retroverted, without noting any  gastric mucosal abnormality.  The pylorus was concentric and easily  intubated, and the duodenum was normal through the descending portion.     Because of the nonspecific nature of the patients symptoms, this may well be  part of the irritable bowel syndrome that she seems to have and should  probably be treated with symptomatic _____ such as inhibitors of acid  secretion and antispasmodic agents.                                            Baxter KailSTEPHEN J Anh Mangano, MD     [id number]  DD: 11/12/2013 18:08  DT: 11/13/2013 11:10  Job #: [id number]  CC: Baxter KailSTEPHEN J Anitra Doxtater, MD  CC: Darrel ReachKATHRYN MARGARET PEEDEN, MD

## 2013-11-12 NOTE — Op Note (Signed)
PATIENT NAME:                 PA #:            MR #Peggy Sanders:                 Rawlins, Caylah           [id number]       [id number]            SURGEON:                              SURG DATE:  DIS DATE:          Baxter KailSTEPHEN J Ollis Daudelin, MD                11/12/2013  11/12/2013         PRIMARY CARE PHYSICIAN:               REFERRING PHYSICIAN:            Darrel ReachKATHRYN MARGARET PEEDEN, MD                                           DATE OF BIRTH:   AGE:           PATIENT TYPE:     RM #:              [birth year]       37             OSJ                                     PREOPERATIVE DIAGNOSIS(ES):  Abdominal pain and constipation.     POSTOPERATIVE DIAGNOSIS(ES):  No diagnostic abnormality.     PROCEDURE(S) PERFORMED:  Colonoscopy.     ANESTHESIA:  See prior endoscopy report.     COMPLICATIONS:  None.      DETAILS OF PROCEDURE:  The Olympus videocolonoscope was intubated  uneventfully into the rectum and advanced uneventfully to the cecum without  noting any mucosal abnormalities.  There were collections of semi-liquid  fluid in the left transverse colon and cecum, which provided optimal  visualization, but for the most part, no abnormalities were noted and the  areas were reasonably well visualized.  The colon was reinspected as the  instrument was being withdrawn and no additional findings were noted.  When  the instrument was withdrawn through the rectum and anus, small internal  hemorrhoids were noted.  The procedure was terminated uneventfully.       Treatment should probably be directed at the patients constipation and there  is a fair possibility that the abdominal discomfort will cease.  Treatment  modalities could ensure a fiber supplement such as Metamucil, or a 100% bran  cereal, and hyoscyamine.                                              Baxter KailSTEPHEN J Tamberly Pomplun, MD     [id number]  DD: 11/12/2013 18:10  DT: 11/13/2013 11:21  Job #: [id number]  CC: Baxter KailSTEPHEN J Ashaunti Treptow, MD  CC: Clyda GreenerKATHRYN MARGARET  Bethena RoysPEEDEN, MD

## 2013-11-12 NOTE — Consults (Signed)
History and Physical / Pre-Sedation Assessment    Patient:  Peggy Sanders   DOB:   04/21/1976     Intended Procedure:  EGD/colonoscopy    HPI: 38 year old woman referred for EGD and colonoscopy because of worsening nausea, LUQ pain, and constipation.  Has history of IBS, but had to be hospitalized recently because of the severity of the abdominal pain. Also diagnosed with fatty liver.    Nurses notes reviewed and agreed.  Medications reviewed  Allergies:   Allergies   Allergen Reactions   ??? Esomeprazole Magnesium Swelling   ??? Lansoprazole Swelling   ??? Protonix [Pantoprazole Sodium] Swelling   ??? Tylenol With Codeine #3 [Acetaminophen-Codeine]      Pt states she passed out when she took the medication.       Physical Exam:  Vital Signs: BP 112/60    Pulse 93    Temp(Src) 98.4 ??F (36.9 ??C) (Oral)    Resp 18    Ht 5\' 4"  (1.626 m)    Wt 265 lb (120.203 kg)    BMI 45.46 kg/m2      SpO2 96%    Airway:Normal  Pulmonary:Normal  Cardiac:Normal  Abdomen:Normal    Pre-Procedure Assessment / Plan:  ASA 1 - Normal healthy patient  Level of Sedation Plan:Moderate sedation  Post Procedure plan: Return to same level of care    I assessed the patient and find that the patient is in satisfactory condition to proceed with the planned procedure and sedation plan.    I have explained the risk, benefits, and alternatives to the procedure; the patient understands and agrees to proceed.       Maryan PulsStephen Tylynn Braniff  2:12 PM 11/12/2013

## 2014-02-07 LAB — COMPREHENSIVE METABOLIC PANEL
ALT: 17 U/L (ref 10–40)
AST: 13 U/L — ABNORMAL LOW (ref 15–37)
Albumin/Globulin Ratio: 1.4 (ref 1.1–2.2)
Albumin: 4.1 g/dL (ref 3.4–5.0)
Alkaline Phosphatase: 72 U/L (ref 40–129)
Anion Gap: 15 (ref 3–16)
BUN: 12 mg/dL (ref 7–20)
CO2: 23 mmol/L (ref 21–32)
Calcium: 9 mg/dL (ref 8.3–10.6)
Chloride: 105 mmol/L (ref 99–110)
Creatinine: 0.6 mg/dL (ref 0.6–1.1)
GFR African American: >60 (ref >60)
GFR Non-African American: >60 (ref >60)
Globulin: 2.9 g/dL
Glucose: 90 mg/dL (ref 70–99)
Potassium: 4.2 mmol/L (ref 3.5–5.1)
Sodium: 143 mmol/L (ref 136–145)
Total Bilirubin: 0.4 mg/dL (ref 0.0–1.0)
Total Protein: 7 g/dL (ref 6.4–8.2)

## 2014-02-07 LAB — LIPID PANEL
Cholesterol, Total: 166 mg/dL (ref 0–199)
HDL: 41 mg/dL (ref 40–60)
LDL Calculated: 90 mg/dL (ref <100)
Triglycerides: 174 mg/dL — ABNORMAL HIGH (ref 0–150)
VLDL Cholesterol Calculated: 35 mg/dL

## 2014-02-07 MED ORDER — LORAZEPAM 0.5 MG PO TABS
0.5 MG | ORAL_TABLET | Freq: Three times a day (TID) | ORAL | Status: AC | PRN
Start: 2014-02-07 — End: 2014-02-14

## 2014-02-07 MED ORDER — RABEPRAZOLE SODIUM 20 MG PO TBEC
20 MG | ORAL_TABLET | Freq: Every day | ORAL | Status: DC
Start: 2014-02-07 — End: 2014-05-09

## 2014-02-07 NOTE — Progress Notes (Signed)
Subjective:      Patient ID: Peggy Sanders is a 38 y.o. female.    HPI  Patient is here for f/u PCOS.  She was unable to tolerate metformin but has been able to lose weight- she has lost about 20 lbs since Feb.  SHe is watching her diet and trying to get regular exercise.    She also needs a RF of her aciphex which he takes for reflux.  She is needing is less now that she has lost weight.    She is requesting a RF of ativan which she takes when she flies.  [place] is going [place] this [place].  She will also have a very rare panic attack.  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     Review of Systems   Constitutional: Negative for unexpected weight change.   Respiratory: Negative for chest tightness and shortness of breath.    Cardiovascular: Negative for chest pain.   Neurological: Negative for dizziness.   Psychiatric/Behavioral: The patient is nervous/anxious.        Objective:   Physical Exam   Constitutional: She is oriented to person, place, and time. She appears well-developed and well-nourished.   HENT:   Head: Normocephalic and atraumatic.   Eyes: Conjunctivae are normal. Pupils are equal, round, and reactive to light.   Cardiovascular: Normal rate, regular rhythm and normal heart sounds.    Pulmonary/Chest: Effort normal and breath sounds normal.   Neurological: She is alert and oriented to person, place, and time.   Psychiatric: She has a normal mood and affect. Her behavior is normal.   Nursing note and vitals reviewed.      Assessment:/Plan:        1) PCOS- ok off metformin, encouraged continued weight loss, FBW today and f/u in 3 mo  2) GERD- RF aciphex  3) Panic- RF ativan  Based on guidelines from the state of [place], an [place]AR[place] report was reviewed on this patient on the date of service, including data from the state of [place], [place], and [place]. Results reviewed for dates of prescription refills, pharmacy used, prescriptions from other  provides, and MED level.  The results discussed with patient if deemed appropriate.  OARRS report either scanned into patient record or shredded after visit per office and state protocol. Report was c/w prescribed medications.

## 2014-02-08 LAB — HEMOGLOBIN A1C
Hemoglobin A1C: 5 %
eAG: 96.8 mg/dL

## 2014-04-01 MED ORDER — FLEXERIL 10 MG PO TABS
10 MG | ORAL_TABLET | Freq: Three times a day (TID) | ORAL | Status: DC | PRN
Start: 2014-04-01 — End: 2014-11-12

## 2014-04-01 MED ORDER — METHYLPREDNISOLONE (PAK) 4 MG PO TABS
4 MG | PACK | ORAL | Status: DC
Start: 2014-04-01 — End: 2014-09-04

## 2014-04-01 NOTE — Progress Notes (Signed)
Chief Complaint    No chief complaint on file.   LEFT shoulder/thoracic pain    History of Present Illness:  Peggy Sanders is a 38 y.o. female.  She presents today for evaluation of left shoulder pain.  The patient reports that she works at [employer] in [place].  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  She reports that recently she felt pain in her shoulder while loading up 50 pound bags of supplies.  Then she went home and was cleaning her pool when she noticed the pain was worsening.  She points primarily to the scapular area of her shoulder is the source of her pain.  She feels a numbness and tingling sensation travel down the LEFT upper extremity to the fingers.  She has difficulty with heavy lifting.    Location of Pain: Shoulder  Location Modifiers: Left  Severity of Pain: 8  Quality of Pain: Aching, Sharp  Duration of Pain: Persistent  Frequency of Pain: Constant  Aggravating Factors: Bending, Other (Comment) (lifting.)  Limiting Behavior: Some  Relieving Factors: Rest, Nsaids  Result of Injury: Yes  Work-Related Injury: No  Are there other pain locations you wish to document?: No]         Medical History:  Patient's medications, allergies, past medical, surgical, social and family histories were reviewed and updated as appropriate.    Review of Systems:  Relevant review of systems reviewed and available in the patient's chart    Vital Signs:  There were no vitals filed for this visit.    General Exam:   Constitutional: Patient is adequately groomed with no evidence of malnutrition  Mental Status: The patient is oriented to time, place and person.  The patient's mood and affect are appropriate.    Shoulder Examination:    Inspection:  Upon inspection, there is no significant deformity of the LEFT shoulder.  The patient is holding her LEFT shoulder in a somewhat protracted position.    Palpation:  She has tenderness through the LEFT trapezium today, no  significant tenderness along the midline of the cervical spine.  Her is also palpable spasm in the LEFT trapezius muscle.    Range of Motion:  0-120?? of forward flexion and abduction of the shoulder as tolerated with tightness elicited again, through the trapezium not necessarily at the shoulder    Strength:  The rotator cuff feels intact today on exam    Special Tests:  She can feel touch through the LEFT upper extremity today.  Her radial pulses palpable and 2+    Skin: There are no rashes, ulcerations or lesions.    Gait: Normal today        Additional Comments:       Additional Examinations:         Right Upper Extremity:  Examination of the right upper extremity does not show any tenderness, deformity or injury.  Range of motion is unremarkable.  There is no gross instability.  There are no rashes, ulcerations or lesions.  Strength and tone are normal.    Radiology:     X-rays obtained and reviewed in office:  Views 3  Location left shoulder  Impression minimally sloped acromion, no evidence of significant acute abnormality such as fracture or dislocation      Assessment :  LEFT shoulder scapulothoracic strain    Impression:  Encounter Diagnosis   Name Primary?   ??? Shoulder pain Yes  Office Procedures:  Orders Placed This Encounter   Procedures   ??? Shoulder Left 3V     73030     Order Specific Question:  Reason for exam:     Answer:  Pain       Treatment Plan:  I talked to the patient about options, I would like her to be very careful about heavy lifting at work she needs to take a break from this in order for her injury to heal, I recommended physical therapy and medication.  We will plan to see her back in 2-3 weeks for follow-up.

## 2014-04-01 NOTE — Patient Instructions (Signed)
Patient was provided with instructions regarding their diagnosis and treatment today.  They voiced understanding of the treatment plan and were instructed on when to return to the clinic.

## 2014-05-09 LAB — LIPID PANEL
Cholesterol, Total: 182 mg/dL (ref 0–199)
HDL: 47 mg/dL (ref 40–60)
LDL Calculated: 104 mg/dL — ABNORMAL HIGH (ref <100)
Triglycerides: 153 mg/dL — ABNORMAL HIGH (ref 0–150)
VLDL Cholesterol Calculated: 31 mg/dL

## 2014-05-09 MED ORDER — LORAZEPAM 0.5 MG PO TABS
0.5 MG | ORAL_TABLET | ORAL | Status: DC
Start: 2014-05-09 — End: 2014-11-12

## 2014-05-09 NOTE — Progress Notes (Signed)
Subjective:      Patient ID: Peggy Sanders is a 38 y.o. female.    HPI  Patient is here for f/u PCOS and fatty liver.  She is no longer taking metformin and continues to lose weight.    She also had hypertriglyceridemia and is due to have that rechecked    She is not needing her medication for GERD as regularly now that she has lost weight.  She is also needing less HA medication    She is requesting a RF of ativan which she travels due to panic.  Review of Systems   Constitutional: Negative for unexpected weight change.   Respiratory: Negative for chest tightness and shortness of breath.    Cardiovascular: Negative for chest pain.   Neurological: Negative for dizziness.   Psychiatric/Behavioral: The patient is nervous/anxious.        Objective:   Physical Exam   Constitutional: She is oriented to person, place, and time. She appears well-developed and well-nourished.   HENT:   Head: Normocephalic and atraumatic.   Eyes: Conjunctivae are normal. Pupils are equal, round, and reactive to light.   Cardiovascular: Normal rate, regular rhythm and normal heart sounds.    Pulmonary/Chest: Effort normal and breath sounds normal.   Neurological: She is alert and oriented to person, place, and time.   Psychiatric: She has a normal mood and affect. Her behavior is normal.   Nursing note and vitals reviewed.      Assessment:/Plan:        1) PCOS- cont weight loss and low carb diet  2) GERD- improved with weight loss  3) Hypertriglyceridemia- check lipids  3) Panic- RF ativan prn only, discussed potential side effects

## 2014-09-04 ENCOUNTER — Ambulatory Visit
Admit: 2014-09-04 | Discharge: 2014-09-04 | Payer: PRIVATE HEALTH INSURANCE | Attending: Family Medicine | Primary: Family Medicine

## 2014-09-04 DIAGNOSIS — H66001 Acute suppurative otitis media without spontaneous rupture of ear drum, right ear: Secondary | ICD-10-CM

## 2014-09-04 LAB — POCT RAPID STREP A: Strep A Ag: NOT DETECTED

## 2014-09-04 MED ORDER — RABEPRAZOLE SODIUM 20 MG PO TBEC
20 MG | ORAL_TABLET | Freq: Every day | ORAL | Status: DC
Start: 2014-09-04 — End: 2015-05-13

## 2014-09-04 MED ORDER — AMOXICILLIN 875 MG PO TABS
875 MG | ORAL_TABLET | Freq: Two times a day (BID) | ORAL | Status: AC
Start: 2014-09-04 — End: 2014-09-14

## 2014-09-04 MED ORDER — PROMETHAZINE-CODEINE 6.25-10 MG/5ML PO SYRP
ORAL | Status: AC | PRN
Start: 2014-09-04 — End: 2014-09-11

## 2014-09-04 NOTE — Progress Notes (Signed)
Subjective:      Patient ID: Peggy Sanders is a 38 y.o. female.    HPI  Patients tates that she satred with ST congestion and sinus pressure about 1 week ago.  SHe states athtshe was traetd for a sinus infection 1 mo ago but never really went away.  SHe has a low grade fever of 100.2  She has had sick contacts.  SHe is not sleeping due to cough.  SHe did have some wheezing on Friday while sleeping.  She has right ear pressure.     SHe is taking mucinex withy no help.     Review of Systems   Constitutional: Positive for fatigue. Negative for fever and unexpected weight change.   HENT: Positive for congestion, ear pain and sinus pressure.    Respiratory: Positive for cough. Negative for chest tightness and shortness of breath.    Cardiovascular: Negative for chest pain.   Neurological: Negative for dizziness.   Psychiatric/Behavioral: The patient is not nervous/anxious.        Objective:   Physical Exam   Constitutional: She appears well-developed and well-nourished.   HENT:   Head: Normocephalic.   Right Ear: Tympanic membrane is erythematous. A middle ear effusion is present.   Left Ear: Tympanic membrane normal.   Nose: No mucosal edema, rhinorrhea or septal deviation. Right sinus exhibits no frontal sinus tenderness. Left sinus exhibits no frontal sinus tenderness.   Mouth/Throat: Mucous membranes are normal. No oral lesions. Posterior oropharyngeal edema and posterior oropharyngeal erythema present. No oropharyngeal exudate.   Eyes: Conjunctivae are normal.   Cardiovascular: Normal rate, regular rhythm and normal heart sounds.    Pulmonary/Chest: Effort normal and breath sounds normal. No respiratory distress. She has no wheezes.   Lymphadenopathy:     She has no cervical adenopathy.   Nursing note and vitals reviewed.      Assessment:/Plan:        1) AOM/URI- meds per orders, call if any increased sx

## 2014-09-19 MED ORDER — AMOXICILLIN-POT CLAVULANATE ER 1000-62.5 MG PO TB12
ORAL_TABLET | Freq: Two times a day (BID) | ORAL | Status: DC
Start: 2014-09-19 — End: 2017-11-10

## 2014-09-19 NOTE — Telephone Encounter (Signed)
Patient states her ear was better after treatment for AOM but then worse again- same ear.    Rx for augmentin sent to pharmacy.  OV if persistent sx

## 2014-11-12 ENCOUNTER — Ambulatory Visit
Admit: 2014-11-12 | Discharge: 2014-11-12 | Payer: PRIVATE HEALTH INSURANCE | Attending: Family Medicine | Primary: Family Medicine

## 2014-11-12 DIAGNOSIS — F419 Anxiety disorder, unspecified: Secondary | ICD-10-CM

## 2014-11-12 MED ORDER — FLUOXETINE HCL 10 MG PO CAPS
10 MG | ORAL_CAPSULE | Freq: Every day | ORAL | Status: DC
Start: 2014-11-12 — End: 2015-03-17

## 2014-11-12 MED ORDER — CYCLOBENZAPRINE HCL 10 MG PO TABS
10 MG | ORAL_TABLET | Freq: Three times a day (TID) | ORAL | Status: DC | PRN
Start: 2014-11-12 — End: 2016-08-06

## 2014-11-12 MED ORDER — RIZATRIPTAN BENZOATE 10 MG PO TBDP
10 MG | ORAL_TABLET | Freq: Once | ORAL | Status: DC | PRN
Start: 2014-11-12 — End: 2015-02-24

## 2014-11-12 MED ORDER — MELOXICAM 15 MG PO TABS
15 MG | ORAL_TABLET | Freq: Every day | ORAL | Status: DC
Start: 2014-11-12 — End: 2016-08-06

## 2014-11-12 MED ORDER — LORAZEPAM 0.5 MG PO TABS
0.5 MG | ORAL_TABLET | ORAL | Status: DC
Start: 2014-11-12 — End: 2015-05-13

## 2014-11-12 NOTE — Progress Notes (Signed)
Subjective:      Patient ID: Peggy Sanders is a 39 y.o. female.    HPI  Patient is here with concerns of increased anxiety from stress.  SHe works at [employer] and this is a very busy time of the year.  She works 10-12 hrs per day.  It causes increased panic and so she is requesting RF of ativan.  SHe is also wanting something to help prevent anxiety.    She is also here for f/u PCOS and fatty liver and hypertriglyceridemia.  She has lost weight and blood work was good last visit.  She states that she has not been as well over the winter.  She is due for fasting blood work.      Review of Systems   Constitutional: Negative for unexpected weight change.   Respiratory: Negative for chest tightness and shortness of breath.    Cardiovascular: Negative for chest pain.   Neurological: Negative for dizziness.   Psychiatric/Behavioral: The patient is nervous/anxious.        Objective:   Physical Exam   Constitutional: She is oriented to person, place, and time. She appears well-developed and well-nourished.   HENT:   Head: Normocephalic and atraumatic.   Eyes: Conjunctivae are normal. Pupils are equal, round, and reactive to light.   Cardiovascular: Normal rate, regular rhythm and normal heart sounds.    Pulmonary/Chest: Effort normal and breath sounds normal.   Neurological: She is alert and oriented to person, place, and time.   Skin: skin colored nevus on left upper lip  Psychiatric: She has a normal mood and affect. Her behavior is normal.   Nursing note and vitals reviewed.      Assessment:/Plan:        1) Anxiety- start prozac, RF xanax prn only  2) Hypertriglyceridemia- check lipids  3) PCOS- stable, check blood sugar. Cont low carb diet, wright loss and regular exercise

## 2014-11-13 LAB — COMPREHENSIVE METABOLIC PANEL
ALT: 15 U/L (ref 10–40)
AST: 12 U/L — ABNORMAL LOW (ref 15–37)
Albumin/Globulin Ratio: 1.3 (ref 1.1–2.2)
Albumin: 4 g/dL (ref 3.4–5.0)
Alkaline Phosphatase: 71 U/L (ref 40–129)
Anion Gap: 14 (ref 3–16)
BUN: 8 mg/dL (ref 7–20)
CO2: 24 mmol/L (ref 21–32)
Calcium: 9.3 mg/dL (ref 8.3–10.6)
Chloride: 103 mmol/L (ref 99–110)
Creatinine: 0.6 mg/dL (ref 0.6–1.1)
GFR African American: >60 (ref >60)
GFR Non-African American: >60 (ref >60)
Globulin: 3.1 g/dL
Glucose: 102 mg/dL — ABNORMAL HIGH (ref 70–99)
Potassium: 4.4 mmol/L (ref 3.5–5.1)
Sodium: 141 mmol/L (ref 136–145)
Total Bilirubin: 0.3 mg/dL (ref 0.0–1.0)
Total Protein: 7.1 g/dL (ref 6.4–8.2)

## 2014-11-13 LAB — LIPID PANEL
Cholesterol, Total: 177 mg/dL (ref 0–199)
HDL: 44 mg/dL (ref 40–60)
LDL Calculated: 100 mg/dL — ABNORMAL HIGH (ref <100)
Triglycerides: 163 mg/dL — ABNORMAL HIGH (ref 0–150)
VLDL Cholesterol Calculated: 33 mg/dL

## 2014-11-13 NOTE — Telephone Encounter (Signed)
After looking at office notes, physician wants this script as needed, advised pharmacy of this but they state it must have daily or a frequency, gave them 1-2 daily prn, use sparingly.

## 2014-11-13 NOTE — Telephone Encounter (Signed)
Loraine LericheMark is calling from [facility] requesting clarification on the Lorazepam prescription  Qty 10 tablets, 1-2 for anxiety.  He needs frequency; daily, twice a day?

## 2015-02-16 DIAGNOSIS — G43909 Migraine, unspecified, not intractable, without status migrainosus: Secondary | ICD-10-CM

## 2015-02-17 ENCOUNTER — Inpatient Hospital Stay: Admit: 2015-02-17 | Discharge: 2015-02-17 | Payer: PRIVATE HEALTH INSURANCE

## 2015-02-17 MED ORDER — LORAZEPAM 2 MG/ML IJ SOLN
2 MG/ML | Freq: Once | INTRAMUSCULAR | Status: AC
Start: 2015-02-17 — End: 2015-02-17
  Administered 2015-02-17: 04:00:00 0.5 mg via INTRAVENOUS

## 2015-02-17 MED ORDER — SODIUM CHLORIDE 0.9 % IV BOLUS
0.9 % | Freq: Once | INTRAVENOUS | Status: AC
Start: 2015-02-17 — End: 2015-02-17
  Administered 2015-02-17: 04:00:00 1000 mL via INTRAVENOUS

## 2015-02-17 MED ORDER — PREDNISONE 10 MG PO TABS
10 MG | ORAL_TABLET | Freq: Every day | ORAL | Status: AC
Start: 2015-02-17 — End: 2015-02-22

## 2015-02-17 MED ORDER — METOCLOPRAMIDE HCL 5 MG/ML IJ SOLN
5 MG/ML | Freq: Once | INTRAMUSCULAR | Status: AC
Start: 2015-02-17 — End: 2015-02-17
  Administered 2015-02-17: 04:00:00 10 mg via INTRAVENOUS

## 2015-02-17 MED ORDER — DOXYCYCLINE MONOHYDRATE 100 MG PO CAPS
100 MG | ORAL_CAPSULE | Freq: Two times a day (BID) | ORAL | Status: AC
Start: 2015-02-17 — End: 2015-03-10

## 2015-02-17 MED ORDER — KETOROLAC TROMETHAMINE 30 MG/ML IJ SOLN
30 MG/ML | Freq: Once | INTRAMUSCULAR | Status: AC
Start: 2015-02-17 — End: 2015-02-17
  Administered 2015-02-17: 04:00:00 30 mg via INTRAVENOUS

## 2015-02-17 MED ORDER — DIPHENHYDRAMINE HCL 50 MG/ML IJ SOLN
50 MG/ML | Freq: Once | INTRAMUSCULAR | Status: AC
Start: 2015-02-17 — End: 2015-02-17
  Administered 2015-02-17: 04:00:00 25 mg via INTRAVENOUS

## 2015-02-17 MED FILL — METOCLOPRAMIDE HCL 5 MG/ML IJ SOLN: 5 MG/ML | INTRAMUSCULAR | Qty: 2

## 2015-02-17 MED FILL — DIPHENHYDRAMINE HCL 50 MG/ML IJ SOLN: 50 MG/ML | INTRAMUSCULAR | Qty: 1

## 2015-02-17 MED FILL — LORAZEPAM 2 MG/ML IJ SOLN: 2 MG/ML | INTRAMUSCULAR | Qty: 1

## 2015-02-17 MED FILL — KETOROLAC TROMETHAMINE 30 MG/ML IJ SOLN: 30 MG/ML | INTRAMUSCULAR | Qty: 1

## 2015-02-17 NOTE — ED Notes (Signed)
Discharge instructions given patient, follow up care discussed, 2 prescriptions given to patient, no questions or concerns.    Makenzey Nanni, RN  02/17/15 [phone]

## 2015-02-17 NOTE — ED Provider Notes (Signed)
[facility]**    [facility]  eMERGENCY dEPARTMENT eNCOUnter      Pt Name: Peggy Sanders  MRN: [id number]  Birthdate Jul 10, 1976  Date of evaluation: 02/16/2015  Provider: Rick DuffMichael Keundra Petrucelli, PA-C      Chief Complaint:    Chief Complaint   Patient presents with   ??? Tick Removal     bit by tick on Tuesday, getting rash on body, having bad headaches not sure if it is tick related or migraine related, poor po for 3 days, pulled tick off back of neck, was embedded, has bump on top of head, thinks she may have a tick bite on top of head       Nursing Notes, Past Medical Hx, Past Surgical Hx, Social Hx, Allergies, and Family Hx were all reviewed and agreed with or any disagreements were addressed in the HPI.    HPI:  (Location, Duration, Timing, Severity, Quality, Assoc Sx, Context, Modifying factors)  This is a  39 y.o. female patient presents with complaint of headache intermittently over the past 3 days.  She had headache on Thursday took some Maxalt and improved Ruckert Friday took some Maxalt and improved.  Headache returned today.  She has taken no Maxalt today.  She indicates headache continues.  She rates her current headache 8-9/10.  Typical headache presentation for this patient.  She has seen neurology in the past.  Was not thunderclap with onset.  She has photophobia without sounds sensitivity.  She needs some nausea without vomiting.  Last headache was in the past possibly 1 or 2 years ago at which time she was treated with intravenous medication which did not include narcotic as best that she recalls.  I didn't find case in the chart where she was treated at this facility in 2013 at which time she did receive Dilaudid at that time.  To the patient when we have tick bite.  She states she pulled a tick off the right occiput a few days ago.  She also now has a rash which is concerning her.  She is worried about Lyme disease.  She gets no arthralgia or myalgia this time.  She is no  shortness of breath or chest pain.  She denies any urologic symptoms.  Tetanus shot is up-to-date as best that she recalls.  This patient is accompanied by her husband.    Past Medical/Surgical History:      Diagnosis Date   ??? GERD (gastroesophageal reflux disease)    ??? PCOS (polycystic ovarian syndrome)      previously on metformin--stopped secondary to side effects   ??? Cervical intraepithelial neoplasia grade III with severe dysplasia 2008     cervical conization, follow up normal x 2 years--pap smear every 6 months   ??? History of ovarian cyst 2008     multiple cystectomies   ??? History of endometriosis 2008     excised in 2008   ??? History of trichomoniasis 2008         Procedure Laterality Date   ??? Tubal ligation     ??? Endometrial ablation  2008   ??? Adenoidectomy     ??? Inner ear surgery       tubes inserted   ??? Cervix surgery     ??? Dilation and curettage  1993       Medications:  Discharge Medication List as of 02/17/2015  1:05 AM      CONTINUE these medications which have NOT  CHANGED    Details   LORazepam (ATIVAN) 0.5 MG tablet 1-2 TABS AS NEEDED FOR ANXIETY, Disp-10 tablet, R-0      cyclobenzaprine (FLEXERIL) 10 MG tablet Take 1 tablet by mouth every 8 hours as needed Sedation precautions, Disp-30 tablet, R-0      meloxicam (MOBIC) 15 MG tablet Take 1 tablet by mouth daily, Disp-30 tablet, R-3      rizatriptan (MAXALT-MLT) 10 MG disintegrating tablet Take 1 tablet by mouth once as needed for Migraine May repeat in 2 hours if needed, Disp-12 tablet, R-3      FLUoxetine (PROZAC) 10 MG capsule Take 1 capsule by mouth daily, Disp-30 capsule, R-3      RABEprazole (ACIPHEX) 20 MG tablet Take 2 tablets by mouth daily TAKE ONE TABLET BY MOUTH EVERY DAY, Disp-60 tablet, R-5      Ranitidine HCl (ZANTAC 75 PO) Take 150 mg by mouth.      albuterol (PROVENTIL HFA) 108 (90 BASE) MCG/ACT inhaler INHALE 2 PUFFS BY MOUTH INTO THE LUNGS EVERY 6 HOURS AS NEEDED FOR WHEEZING, Disp-1 Inhaler, R-3      albuterol (PROVENTIL) (5 MG/ML)  0.5% nebulizer solution Take 1 mL by nebulization 4 times daily as needed for Wheezing., Disp-120 each, R-0      rizatriptan (MAXALT-MLT) 10 MG disintegrating tablet Take 10 mg by mouth once as needed for Migraine. May repeat in 2 hours if needed               Review of Systems:  Review of Systems   Constitutional: Negative for fever and chills.   HENT: Negative for congestion.    Eyes: Positive for photophobia.   Respiratory: Negative for cough and shortness of breath.    Cardiovascular: Negative for chest pain.   Gastrointestinal: Positive for nausea. Negative for vomiting, abdominal pain and constipation.   Musculoskeletal: Negative for neck pain and neck stiffness.   Skin: Positive for rash.   Neurological: Positive for headaches.   Psychiatric/Behavioral: The patient is nervous/anxious.    All other systems reviewed and are negative.    Positives and Pertinent negatives as per HPI.  Except as noted above in the ROS, problem specific ROS was completed and is negative.    Physical Exam:  Physical Exam   Constitutional: She is oriented to person, place, and time. She appears well-developed and well-nourished.   HENT:   Head: Normocephalic and atraumatic.   Right Ear: External ear normal.   Left Ear: External ear normal.   Eyes: Conjunctivae are normal. Right eye exhibits no discharge. Left eye exhibits no discharge. No scleral icterus.   Neck: Normal range of motion.   Cardiovascular: Normal rate, regular rhythm and normal heart sounds.    Pulmonary/Chest: Effort normal and breath sounds normal.   Abdominal: Soft. Bowel sounds are normal.   Musculoskeletal: Normal range of motion.   Neurological: She is alert and oriented to person, place, and time.   Skin: Skin is warm and dry.   Patient does have a macular rash with very seldom scattered lesions.  One character is on the dorsum of the dominant right hand.  Appears to be circular and appears very central area.  Suggestive somewhat of a target type lesion.  It is  nonpruritic.   Psychiatric: She has a normal mood and affect. Her behavior is normal. Judgment and thought content normal.   Nursing note and vitals reviewed.      MEDICAL DECISION MAKING    Vitals:  Filed Vitals:    02/16/15 2354   BP: 140/89   Pulse: 92   Temp: 97.7 ??F (36.5 ??C)   TempSrc: Oral   Resp: 16   Height: 5\' 4"  (1.626 m)   Weight: 233 lb (105.688 kg)   SpO2: 98%       LABS: Labs Reviewed - No data to display     Remainder of labs reviewed and were negative at this time or not returned at the time of this note.    RADIOLOGY:   Non-plain film images such as CT, Ultrasound and MRI are read by the radiologist. I, Rick Duff, PA-C have directly visualized the radiologic plain film image(s) with the below findings:    Not indicated    Interpretation per the Radiologist below, if available at the time of this note:    No orders to display        No results found.     MEDICAL DECISION MAKING / ED COURSE:      PROCEDURES:   Procedures    None    Patient was given:  Medications   LORazepam (ATIVAN) injection 0.5 mg (0.5 mg Intravenous Given 02/17/15 0026)   metoclopramide (REGLAN) injection 10 mg (10 mg Intravenous Given 02/17/15 0026)   diphenhydrAMINE (BENADRYL) injection 25 mg (25 mg Intravenous Given 02/17/15 0026)   ketorolac (TORADOL) injection 30 mg (30 mg Intravenous Given 02/17/15 0027)   0.9 % sodium chloride bolus (0 mLs Intravenous Stopped 02/17/15 0113)       She will classic migraine headache.  She received intravenous saline 1000 ML.  She in addition received Toradol 30 mg IV, Benadryl 25 mg IV, Reglan 10 mg IV and Ativan 0.5 mg IV.  Her headache initially was 8-9/10 and at discharge is/10.  She was considerably improved.  Patient discharged home with prednisone 40 mg daily for the next 5 days.  This will be utilized to break the migraine cycle.  Patient also given prescription for doxycycline 100 mg twice a day #42.  Advised to follow with PCP in 2 weeks for follow-up visit.  She is to contact  neurology with [facility] or [facility] to schedule with headache specialist.  The patient verses understanding of the diagnosis and treatment plan.  I have independently evaluated and treated the patient.    The patient tolerated their visit well.  I saw the patient independently with physician available for consultation as needed.  The patient and / or the family were informed of the results of any tests, a time was given to answer questions, a plan was proposed and they agreed with plan.      CLINICAL IMPRESSION:  1. Migraine without status migrainosus, not intractable, unspecified migraine type    2. Rash    3. Tick bite of scalp, initial encounter        DISPOSITION Decision to Discharge    PATIENT REFERRED TO:  Gwendel Hanson, MD  [address]  [address]  [place] [address]  [phone]    Schedule an appointment as soon as possible for a visit in 2 weeks        DISCHARGE MEDICATIONS:  Discharge Medication List as of 02/17/2015  1:05 AM      START taking these medications    Details   predniSONE (DELTASONE) 10 MG tablet Take 4 tablets by mouth daily for 5 doses, Disp-20 tablet, R-0      doxycycline (MONODOX) 100 MG capsule Take 1 capsule by mouth  2 times daily for 21 days, Disp-42 capsule, R-0             DISCONTINUED MEDICATIONS:  Discharge Medication List as of 02/17/2015  1:05 AM                 (Please note the MDM and HPI sections of this note were completed with a voice recognition program.  Efforts were made to edit the dictations but occasionally words are mis-transcribed.)    Electronically signed, Rick Duff, Dennard Schaumann, PA-C  02/17/15 [phone]

## 2015-02-24 ENCOUNTER — Inpatient Hospital Stay: Attending: Family Medicine | Primary: Family Medicine

## 2015-02-24 ENCOUNTER — Ambulatory Visit
Admit: 2015-02-24 | Discharge: 2015-02-24 | Payer: PRIVATE HEALTH INSURANCE | Attending: Family Medicine | Primary: Family Medicine

## 2015-02-24 DIAGNOSIS — W57XXXA Bitten or stung by nonvenomous insect and other nonvenomous arthropods, initial encounter: Secondary | ICD-10-CM

## 2015-02-24 DIAGNOSIS — A770 Spotted fever due to Rickettsia rickettsii: Secondary | ICD-10-CM

## 2015-02-24 MED ORDER — KETOROLAC TROMETHAMINE 15 MG/ML IJ SOLN
15 MG/ML | Freq: Once | INTRAMUSCULAR | Status: AC
Start: 2015-02-24 — End: 2015-02-24
  Administered 2015-02-24: 21:00:00 15 mg via INTRAVENOUS

## 2015-02-24 MED ORDER — PROMETHAZINE HCL 25 MG PO TABS
25 MG | ORAL_TABLET | Freq: Four times a day (QID) | ORAL | Status: AC | PRN
Start: 2015-02-24 — End: 2015-03-03

## 2015-02-24 NOTE — Progress Notes (Signed)
Subjective:      Patient ID: Peggy Sanders is a 39 y.o. female.    HPI  Patient was seen in the ER 1 1/2 weeks ago with tick bite.  She states that she had a rash that was on her hands and feet and spreading upward.  She found the tick 2 weeks ago Sunday.  She states that she had to dig the tick out of the back of her neck.  SHe is unsure how long that it had been there.  She is still having HA, worse recently.  She was started on doxycycline and a steroid in ER.  She felt better on steroid but now having achiness, fatigue and HA again.  SHe denies a fever.    Review of Systems   Constitutional: Positive for fatigue. Negative for unexpected weight change.   Respiratory: Negative for chest tightness and shortness of breath.    Cardiovascular: Negative for chest pain.   Neurological: Positive for headaches. Negative for dizziness.   Psychiatric/Behavioral: The patient is not nervous/anxious.        Objective:   Physical Exam   Constitutional: She is oriented to person, place, and time. She appears well-developed and well-nourished.   HENT:   Head: Normocephalic and atraumatic.   Eyes: Conjunctivae are normal. Pupils are equal, round, and reactive to light.   Cardiovascular: Normal rate, regular rhythm and normal heart sounds.    Pulmonary/Chest: Effort normal and breath sounds normal.   Abdominal: Soft. Bowel sounds are normal. There is no tenderness.   Neurological: She is alert and oriented to person, place, and time.   Skin: Rash noted.   3 approx 5-7 mm erythematous macules in right posterior hand distal to wrist   Psychiatric: She has a normal mood and affect. Her behavior is normal.   Nursing note and vitals reviewed.      Assessment:/Plan:        1) RMSF- recent tick bite, still with significant sx despite doxycycline, check BW per orders  IM toradol today for HA, close follow up

## 2015-02-24 NOTE — Patient Instructions (Signed)
Name and dose of Injection: Ketorolac  Lot #: [id number]  Manufacturer name: [employer]  Expiration date: 09/21/2015  Site / Route: Left deltoid  NDC: [id number]  Date given: February 24, 2015  VIS given / date: No / 2016    Administered by: dh

## 2015-02-24 NOTE — Telephone Encounter (Signed)
error 

## 2015-02-25 LAB — CBC WITH AUTO DIFFERENTIAL
Basophils %: 0.2 %
Basophils Absolute: 0 10*3/uL (ref 0.0–0.2)
Eosinophils %: 0.5 %
Eosinophils Absolute: 0.1 10*3/uL (ref 0.0–0.6)
Hematocrit: 42.5 % (ref 36.0–48.0)
Hemoglobin: 14.8 g/dL (ref 12.0–16.0)
Lymphocytes %: 26.1 %
Lymphocytes Absolute: 3.2 10*3/uL (ref 1.0–5.1)
MCH: 31.5 pg (ref 26.0–34.0)
MCHC: 34.8 g/dL (ref 31.0–36.0)
MCV: 90.6 fL (ref 80.0–100.0)
MPV: 7.7 fL (ref 5.0–10.5)
Monocytes %: 7 %
Monocytes Absolute: 0.9 10*3/uL (ref 0.0–1.3)
Neutrophils %: 66.2 %
Neutrophils Absolute: 8.2 10*3/uL — ABNORMAL HIGH (ref 1.7–7.7)
Platelets: 292 10*3/uL (ref 135–450)
RBC: 4.69 M/uL (ref 4.00–5.20)
RDW: 12.6 % (ref 12.4–15.4)
WBC: 12.4 10*3/uL — ABNORMAL HIGH (ref 4.0–11.0)

## 2015-02-25 LAB — COMPREHENSIVE METABOLIC PANEL
ALT: 17 U/L (ref 10–40)
AST: 14 U/L — ABNORMAL LOW (ref 15–37)
Albumin/Globulin Ratio: 1.2 (ref 1.1–2.2)
Albumin: 3.7 g/dL (ref 3.4–5.0)
Alkaline Phosphatase: 63 U/L (ref 40–129)
Anion Gap: 14 (ref 3–16)
BUN: 10 mg/dL (ref 7–20)
CO2: 24 mmol/L (ref 21–32)
Calcium: 9 mg/dL (ref 8.3–10.6)
Chloride: 100 mmol/L (ref 99–110)
Creatinine: 0.7 mg/dL (ref 0.6–1.1)
GFR African American: >60 (ref >60)
GFR Non-African American: >60 (ref >60)
Globulin: 3.1 g/dL
Glucose: 99 mg/dL (ref 70–99)
Potassium: 3.8 mmol/L (ref 3.5–5.1)
Sodium: 138 mmol/L (ref 136–145)
Total Bilirubin: 0.3 mg/dL (ref 0.0–1.0)
Total Protein: 6.8 g/dL (ref 6.4–8.2)

## 2015-02-27 LAB — RICKETTSIA RICKETTSII (ROCKY MOUNTAIN SPOTTED FEVER - RMSF) ANTIBODIES IGG & IGM BY IFA
IgG,Rocky Mtn Spotted Fever: <1:64 {titer}
Rocky Mountain Spotted Fever Ab IgM,: <1:64 {titer}

## 2015-02-27 NOTE — Telephone Encounter (Signed)
Per billing: 2 issues: missing NDC code on medication and an external cause code cannot be a primary dx. Please use the injury suffered from this external cause as the primary dx.      For questions or clarification, please call Olegario Messier at [phone]  DOS: 6/6  Let me know when this is completed.     Thank you, Baxter Hire

## 2015-03-17 ENCOUNTER — Encounter

## 2015-03-17 MED ORDER — FLUOXETINE HCL 10 MG PO CAPS
10 MG | ORAL_CAPSULE | ORAL | 2 refills | Status: DC
Start: 2015-03-17 — End: 2015-05-13

## 2015-03-17 NOTE — Telephone Encounter (Signed)
Peggy KussmaulKimberley A Martorelli is requesting refill(s)   Last office visit 11-12-14 (pertaining to medication)  Last refill 11-12-14 #30 3RF (per medication requested)  Next office visit scheduled or attempted Yes   If "no"; explain reason:   Office visit scheduled 05-13-15

## 2015-04-21 ENCOUNTER — Encounter

## 2015-04-22 NOTE — Telephone Encounter (Signed)
Please call to see if she is needing a RF

## 2015-04-22 NOTE — Telephone Encounter (Signed)
Peggy Sanders is requesting refill(s)   Last OV 11/12/14 (pertaining to medication)  LR 11/12/14 (per medication requested)  Next office visit scheduled or attempted Yes   If no, reason:  05/13/15

## 2015-04-23 NOTE — Telephone Encounter (Signed)
I spoke with the patient and she would have needed this but she flies out tomorrow so she asked too late, she said it is ok, she will get the script when she come in to see you later this month for other flights that she has later this month also.

## 2015-05-13 ENCOUNTER — Ambulatory Visit
Admit: 2015-05-13 | Discharge: 2015-05-13 | Payer: PRIVATE HEALTH INSURANCE | Attending: Family Medicine | Primary: Family Medicine

## 2015-05-13 DIAGNOSIS — K219 Gastro-esophageal reflux disease without esophagitis: Secondary | ICD-10-CM

## 2015-05-13 LAB — COMPREHENSIVE METABOLIC PANEL
ALT: 15 U/L (ref 10–40)
AST: 12 U/L — ABNORMAL LOW (ref 15–37)
Albumin/Globulin Ratio: 1.5 (ref 1.1–2.2)
Albumin: 4 g/dL (ref 3.4–5.0)
Alkaline Phosphatase: 68 U/L (ref 40–129)
Anion Gap: 14 (ref 3–16)
BUN: 10 mg/dL (ref 7–20)
CO2: 24 mmol/L (ref 21–32)
Calcium: 9.3 mg/dL (ref 8.3–10.6)
Chloride: 103 mmol/L (ref 99–110)
Creatinine: 0.6 mg/dL (ref 0.6–1.1)
GFR African American: >60 (ref >60)
GFR Non-African American: >60 (ref >60)
Globulin: 2.6 g/dL
Glucose: 88 mg/dL (ref 70–99)
Potassium: 4.3 mmol/L (ref 3.5–5.1)
Sodium: 141 mmol/L (ref 136–145)
Total Bilirubin: 0.3 mg/dL (ref 0.0–1.0)
Total Protein: 6.6 g/dL (ref 6.4–8.2)

## 2015-05-13 LAB — LIPID PANEL
Cholesterol, Total: 178 mg/dL (ref 0–199)
HDL: 45 mg/dL (ref 40–60)
LDL Calculated: 111 mg/dL — ABNORMAL HIGH (ref <100)
Triglycerides: 111 mg/dL (ref 0–150)
VLDL Cholesterol Calculated: 22 mg/dL

## 2015-05-13 MED ORDER — LORAZEPAM 0.5 MG PO TABS
0.5 MG | ORAL_TABLET | ORAL | 0 refills | Status: DC
Start: 2015-05-13 — End: 2015-09-18

## 2015-05-13 MED ORDER — FLUOXETINE HCL 20 MG PO CAPS
20 MG | ORAL_CAPSULE | Freq: Every day | ORAL | 3 refills | Status: DC
Start: 2015-05-13 — End: 2015-05-19

## 2015-05-13 MED ORDER — RABEPRAZOLE SODIUM 20 MG PO TBEC
20 MG | ORAL_TABLET | Freq: Every day | ORAL | 5 refills | Status: DC
Start: 2015-05-13 — End: 2016-12-03

## 2015-05-13 NOTE — Progress Notes (Signed)
Subjective:      Patient ID: Peggy Sanders is a 39 y.o. female.    HPI  Patient is here for follow-up anxiety. She was started on Prozac and does feel like it is helping. However she is wondering about increasing the dose. She is starting to have more difficulty sleeping recently and feels like her anxiety is slightly increased.  She is traveling per airplane next month and is requesting a refill of Ativan to take as needed for flight.    She is also here for follow-up of hypertriglyceridemia. She is fasting for blood work. She has lost weight per dieting. She has not started exercising yet but is planning to start.    She is also here for follow-up reflux. She has decreased her AcipHex and will continue to try to wean.      Review of Systems   Constitutional: Negative for unexpected weight change.   Respiratory: Negative for chest tightness and shortness of breath.    Cardiovascular: Negative for chest pain.   Neurological: Negative for dizziness.   Psychiatric/Behavioral: The patient is nervous/anxious.        Objective:   Physical Exam   Constitutional: She is oriented to person, place, and time. She appears well-developed and well-nourished.   HENT:   Head: Normocephalic and atraumatic.   Eyes: Conjunctivae are normal. Pupils are equal, round, and reactive to light.   Cardiovascular: Normal rate, regular rhythm and normal heart sounds.    Pulmonary/Chest: Effort normal and breath sounds normal.   Neurological: She is alert and oriented to person, place, and time.   Psychiatric: She has a normal mood and affect. Her behavior is normal.   Nursing note and vitals reviewed.      Assessment:/Plan:        1) Anxiety- increased prozac, ativan prn only  2) Hypertriglyceridemia- check cmp, lipids today  3) GERD- RF aciphex, discussed trying to wean

## 2015-05-19 MED ORDER — FLUOXETINE HCL 10 MG PO CAPS
10 MG | ORAL_CAPSULE | Freq: Every day | ORAL | 3 refills | Status: DC
Start: 2015-05-19 — End: 2015-09-18

## 2015-05-19 NOTE — Telephone Encounter (Signed)
I sent the Rx to the pharmacy.

## 2015-05-19 NOTE — Telephone Encounter (Signed)
I spoke with patient and she is aware medication was sent to pharmacy.

## 2015-05-19 NOTE — Telephone Encounter (Signed)
The patient had discussed with Dr. Bethena Roys at her last visit on 05/13/15 about increasing her Prozac prescription to 20 mg.  She's been taking the 20 mg and believes its too much and would like to switch back to the 10 mg if possible.

## 2015-09-18 ENCOUNTER — Ambulatory Visit
Admit: 2015-09-18 | Discharge: 2015-09-18 | Payer: PRIVATE HEALTH INSURANCE | Attending: Family Medicine | Primary: Family Medicine

## 2015-09-18 DIAGNOSIS — J069 Acute upper respiratory infection, unspecified: Secondary | ICD-10-CM

## 2015-09-18 MED ORDER — CEPHALEXIN 500 MG PO CAPS
500 MG | ORAL_CAPSULE | Freq: Four times a day (QID) | ORAL | 0 refills | Status: DC
Start: 2015-09-18 — End: 2016-12-03

## 2015-09-18 MED ORDER — FLUOXETINE HCL 10 MG PO CAPS
10 MG | ORAL_CAPSULE | Freq: Every day | ORAL | 1 refills | Status: DC
Start: 2015-09-18 — End: 2016-04-13

## 2015-09-18 MED ORDER — LORAZEPAM 0.5 MG PO TABS
0.5 MG | ORAL_TABLET | Freq: Two times a day (BID) | ORAL | 0 refills | Status: DC | PRN
Start: 2015-09-18 — End: 2016-08-06

## 2015-09-18 MED ORDER — KETOROLAC TROMETHAMINE 10 MG PO TABS
10 MG | ORAL_TABLET | Freq: Four times a day (QID) | ORAL | 0 refills | Status: DC | PRN
Start: 2015-09-18 — End: 2016-08-06

## 2015-09-18 NOTE — Progress Notes (Signed)
SUBJECTIVE:  C/O Pressure in both ears for past week, some right earache. No cough, sore throat or chest pain.  Also F/U Migraine headaches and Anxiety - desires refills of Ativan, Toradol, and Prozac. She takes Ativan only prn on stressful days, with relief of sx's, and no sedation or other side effects. She keeps Toradol available for occasional use when usual migraine meds are not effective.  Tob: 1/2 ppd    OBJECTIVE:  Visit Vitals   ??? BP 130/70 (Site: Left Arm, Position: Sitting, Cuff Size: Large Adult)   ??? Pulse 76   ??? Temp 98.1 ??F (36.7 ??C) (Oral)   ??? Resp 16   ??? Ht 5\' 4"  (1.626 m)   ??? Wt 235 lb (106.6 kg)   ??? SpO2 98%   ??? Breastfeeding No   ??? BMI 40.34 kg/m2       Normal mood and affect.  ENT: Pharynx appears clear without exudate or drainage. No nasal lesions noted. Ear canals clear bilaterally. Tympanic membranes appear normal without injection bilaterally.  EYES: No conjunctival injection bilaterally. Sclerae non-icteric.  LUNGS: Clear to auscultation bilaterally.    ASSESSMENT:  1. URI, acute    2. Anxiety    3. Migraine with aura and without status migrainosus, not intractable        PLAN:  Orders Placed This Encounter   Medications   ??? FLUoxetine (PROZAC) 10 MG capsule     Sig: Take 1 capsule by mouth daily     Dispense:  90 capsule     Refill:  1   ??? LORazepam (ATIVAN) 0.5 MG tablet     Sig: Take 1 tablet by mouth 2 times daily as needed for Anxiety     Dispense:  30 tablet     Refill:  0   ??? ketorolac (TORADOL) 10 MG tablet     Sig: Take 1 tablet by mouth every 6 hours as needed for Pain     Dispense:  20 tablet     Refill:  0   ??? cephALEXin (KEFLEX) 500 MG capsule     Sig: Take 1 capsule by mouth 4 times daily     Dispense:  40 capsule     Refill:  0       Controlled Substances Monitoring: Attestation: The Prescription Monitoring Report for this patient was reviewed today. Modena Jansky(Ovadia Lopp Rae Azya Barbero, MD)  Documentation: Possible medication side effects, risk of tolerance and/or dependence, and alternative  treatments discussed, No signs of potential drug abuse or diversion identified., Medication contract signed today. Modena Jansky(Zilah Villaflor Rae Keriana Sarsfield, MD)

## 2015-11-25 MED ORDER — ALBUTEROL SULFATE HFA 108 (90 BASE) MCG/ACT IN AERS
108 (90 Base) MCG/ACT | RESPIRATORY_TRACT | 3 refills | Status: DC
Start: 2015-11-25 — End: 2016-08-06

## 2015-11-25 NOTE — Telephone Encounter (Signed)
Patient called requesting Rx for her albuterol sulfate HFA inhaler. [facility].  Said her husband has had the flu or something and now she has it.

## 2016-04-13 MED ORDER — FLUOXETINE HCL 10 MG PO CAPS
10 MG | ORAL_CAPSULE | Freq: Every day | ORAL | 1 refills | Status: DC
Start: 2016-04-13 — End: 2016-08-06

## 2016-04-13 NOTE — Telephone Encounter (Signed)
Patient notified

## 2016-08-06 ENCOUNTER — Ambulatory Visit
Admit: 2016-08-06 | Discharge: 2016-08-06 | Payer: PRIVATE HEALTH INSURANCE | Attending: Family Medicine | Primary: Family Medicine

## 2016-08-06 DIAGNOSIS — G43109 Migraine with aura, not intractable, without status migrainosus: Secondary | ICD-10-CM

## 2016-08-06 MED ORDER — METOCLOPRAMIDE HCL 10 MG PO TABS
10 MG | ORAL_TABLET | Freq: Four times a day (QID) | ORAL | 3 refills | Status: AC | PRN
Start: 2016-08-06 — End: ?

## 2016-08-06 MED ORDER — FLUOXETINE HCL 10 MG PO CAPS
10 MG | ORAL_CAPSULE | Freq: Every day | ORAL | 2 refills | Status: DC
Start: 2016-08-06 — End: 2017-03-15

## 2016-08-06 MED ORDER — LORAZEPAM 0.5 MG PO TABS
0.5 MG | ORAL_TABLET | Freq: Two times a day (BID) | ORAL | 0 refills | Status: DC | PRN
Start: 2016-08-06 — End: 2017-01-06

## 2016-08-06 MED ORDER — MELOXICAM 15 MG PO TABS
15 MG | ORAL_TABLET | Freq: Every day | ORAL | 2 refills | Status: DC
Start: 2016-08-06 — End: 2018-05-04

## 2016-08-06 MED ORDER — CYCLOBENZAPRINE HCL 10 MG PO TABS
10 MG | ORAL_TABLET | Freq: Three times a day (TID) | ORAL | 2 refills | Status: AC | PRN
Start: 2016-08-06 — End: ?

## 2016-08-06 MED ORDER — KETOROLAC TROMETHAMINE 10 MG PO TABS
10 MG | ORAL_TABLET | Freq: Four times a day (QID) | ORAL | 0 refills | Status: DC | PRN
Start: 2016-08-06 — End: 2016-12-03

## 2016-08-06 MED ORDER — RIZATRIPTAN BENZOATE 10 MG PO TBDP
10 MG | ORAL_TABLET | Freq: Once | ORAL | 3 refills | Status: DC | PRN
Start: 2016-08-06 — End: 2016-12-03

## 2016-08-06 MED ORDER — ALBUTEROL SULFATE HFA 108 (90 BASE) MCG/ACT IN AERS
108 (90 Base) MCG/ACT | RESPIRATORY_TRACT | 3 refills | Status: DC
Start: 2016-08-06 — End: 2016-12-03

## 2016-08-06 NOTE — Telephone Encounter (Signed)
Claudette/Front Desk - Can let her know I am only accepting patients who are not taking any controlled substances on a regular basis, so unfortunately, no.

## 2016-08-06 NOTE — Telephone Encounter (Signed)
Patient called looking to reestablish with Dr.Tegtmeier. She was a patient of Dr.Tegtmeier and had [facility] but she left.[facility] she [facility] she is looking to see if Dr.Tegtmeier would take her back as a new patient. Patient takes Acid Reflex & Migraines medication & Ativan(Anxiety),Please advise

## 2016-08-06 NOTE — Progress Notes (Signed)
SUBJECTIVE:  F/U Migraine headaches and Anxiety - desires refills of Ativan, Toradol, and Prozac. She takes Ativan only prn on stressful days, with relief of sx's, and no sedation or other side effects. She keeps Toradol available for occasional use when usual migraine meds are not effective.  Tob: 1/2 ppd    OBJECTIVE:  BP 122/80 (Site: Left Arm, Position: Sitting, Cuff Size: Large Adult)    Pulse 100    Temp 98 ??F (36.7 ??C) (Oral)    Resp 16    Ht 5\' 4"  (1.626 m)    Wt 256 lb (116.1 kg)    SpO2 98%    BMI 43.94 kg/m??     Normal mood and affect.    ASSESSMENT:  1. Migraine with aura and without status migrainosus, not intractable    2. Anxiety        PLAN:  Orders Placed This Encounter   Medications   ??? albuterol sulfate HFA (PROVENTIL HFA) 108 (90 Base) MCG/ACT inhaler     Sig: INHALE 2 PUFFS BY MOUTH INTO THE LUNGS EVERY 6 HOURS AS NEEDED FOR WHEEZING     Dispense:  2 Inhaler     Refill:  3   ??? cyclobenzaprine (FLEXERIL) 10 MG tablet     Sig: Take 1 tablet by mouth every 8 hours as needed (prn) Sedation precautions     Dispense:  180 tablet     Refill:  2   ??? FLUoxetine (PROZAC) 10 MG capsule     Sig: Take 1 capsule by mouth daily     Dispense:  90 capsule     Refill:  2   ??? ketorolac (TORADOL) 10 MG tablet     Sig: Take 1 tablet by mouth every 6 hours as needed for Pain     Dispense:  20 tablet     Refill:  0   ??? LORazepam (ATIVAN) 0.5 MG tablet     Sig: Take 1 tablet by mouth 2 times daily as needed for Anxiety .     Dispense:  60 tablet     Refill:  0   ??? meloxicam (MOBIC) 15 MG tablet     Sig: Take 1 tablet by mouth daily     Dispense:  90 tablet     Refill:  2   ??? rizatriptan (MAXALT-MLT) 10 MG disintegrating tablet     Sig: Take 1 tablet by mouth once as needed for Migraine May repeat in 2 hours if needed     Dispense:  9 tablet     Refill:  3   ??? metoclopramide (REGLAN) 10 MG tablet     Sig: Take 1 tablet by mouth 4 times daily as needed (nausea)     Dispense:  120 tablet     Refill:  3       Controlled  Substances Monitoring: Attestation: The Prescription Monitoring Report for this patient was reviewed today. Modena Jansky(Zaylia Riolo Rae Mccayla Shimada, MD)  Documentation: Possible medication side effects, risk of tolerance and/or dependence, and alternative treatments discussed., No signs of potential drug abuse or diversion identified., Existing medication contract. Modena Jansky(Jayleigh Notarianni Rae Abryanna Musolino, MD)     Migraine management discussed.    15 minutes were spent with patient . Greater than 50% continuity of care or counseling.

## 2016-08-09 NOTE — Telephone Encounter (Signed)
Patient advised.

## 2016-12-03 ENCOUNTER — Ambulatory Visit
Admit: 2016-12-03 | Discharge: 2016-12-03 | Payer: PRIVATE HEALTH INSURANCE | Attending: Family Medicine | Primary: Family Medicine

## 2016-12-03 DIAGNOSIS — G43109 Migraine with aura, not intractable, without status migrainosus: Secondary | ICD-10-CM

## 2016-12-03 MED ORDER — ONDANSETRON 4 MG PO TBDP
4 MG | ORAL_TABLET | Freq: Three times a day (TID) | ORAL | 1 refills | Status: AC | PRN
Start: 2016-12-03 — End: ?

## 2016-12-03 MED ORDER — RIZATRIPTAN BENZOATE 10 MG PO TBDP
10 MG | ORAL_TABLET | Freq: Once | ORAL | 5 refills | Status: DC | PRN
Start: 2016-12-03 — End: 2019-08-19

## 2016-12-03 MED ORDER — RABEPRAZOLE SODIUM 20 MG PO TBEC
20 MG | ORAL_TABLET | Freq: Every day | ORAL | 5 refills | Status: DC
Start: 2016-12-03 — End: 2018-05-05

## 2016-12-03 MED ORDER — ALBUTEROL SULFATE HFA 108 (90 BASE) MCG/ACT IN AERS
108 | RESPIRATORY_TRACT | 11 refills | Status: AC
Start: 2016-12-03 — End: ?

## 2016-12-03 MED ORDER — HYDROXYZINE HCL 25 MG PO TABS
25 MG | ORAL_TABLET | Freq: Three times a day (TID) | ORAL | 0 refills | Status: DC | PRN
Start: 2016-12-03 — End: 2017-01-06

## 2016-12-03 MED ORDER — BUSPIRONE HCL 10 MG PO TABS
10 MG | ORAL_TABLET | Freq: Three times a day (TID) | ORAL | 1 refills | Status: DC | PRN
Start: 2016-12-03 — End: 2017-01-06

## 2016-12-03 MED ORDER — KETOROLAC TROMETHAMINE 10 MG PO TABS
10 MG | ORAL_TABLET | Freq: Four times a day (QID) | ORAL | 5 refills | Status: DC | PRN
Start: 2016-12-03 — End: 2017-12-22

## 2016-12-03 NOTE — Progress Notes (Signed)
Peggy Sanders is a 41 y.o. female    Chief Complaint   Patient presents with   . Establish Care   . Migraine   . Anxiety   . Gastroesophageal Reflux       HPI:    This is a new patient to me.     Migraine    This is a recurrent problem. The current episode started more than 1 year ago. The problem occurs intermittently. The problem has been gradually worsening. The pain is located in the bilateral region. Associated symptoms include nausea, phonophobia and photophobia. She has tried triptans for the symptoms. The treatment provided significant relief. Her past medical history is significant for migraine headaches.     Anxiety. This is a chronic condition. The patient takes Prozac daily. She tried to have her dose increased to 20 mg but could not tolerate it. She takes Ativan as needed. Right now is her busy season and she is taking it 3-4 times per week. She has never tried BuSpar or Atarax before.    GERD. She has had an EGD demonstrating esophagitis. She has failed many other proton pump inhibitors. She does well with AcipHex. She needs a refill on this medicine.    The patient does snore and has fatigue. She was told she needed a sleep study but never followed through as her previous doctor had left. She does get migraine headaches.    ROS:    Review of Systems   Eyes: Positive for photophobia.   Gastrointestinal: Positive for nausea.       BP 122/86 (Site: Left Arm, Position: Sitting, Cuff Size: Large Adult)   Pulse 89   Wt 257 lb (116.6 kg)   SpO2 98%   Breastfeeding? No   BMI 44.11 kg/m     Physical Exam:    Physical Exam   Constitutional: She appears well-developed. No distress.   Cardiovascular: Normal rate and regular rhythm.    No murmur heard.  Pulmonary/Chest: Effort normal and breath sounds normal. No respiratory distress.   Neurological: She is alert.   Skin: She is not diaphoretic.   Psychiatric: Her mood appears anxious. Her affect is not angry. Thought content is not paranoid. She does  not exhibit a depressed mood.       Current Outpatient Prescriptions   Medication Sig Dispense Refill   . ondansetron (ZOFRAN ODT) 4 MG disintegrating tablet Take 1 tablet by mouth every 8 hours as needed for Nausea or Vomiting 30 tablet 1   . busPIRone (BUSPAR) 10 MG tablet Take 1 tablet by mouth 3 times daily as needed (anxiety) 30 tablet 1   . ketorolac (TORADOL) 10 MG tablet Take 1 tablet by mouth every 6 hours as needed for Pain 20 tablet 5   . RABEprazole (ACIPHEX) 20 MG tablet Take 1 tablet by mouth daily 30 tablet 5   . rizatriptan (MAXALT-MLT) 10 MG disintegrating tablet Take 1 tablet by mouth once as needed for Migraine May repeat in 2 hours if needed 9 tablet 5   . albuterol sulfate HFA (PROVENTIL HFA) 108 (90 Base) MCG/ACT inhaler INHALE 2 PUFFS BY MOUTH INTO THE LUNGS EVERY 6 HOURS AS NEEDED FOR WHEEZING 1 Inhaler 11   . hydrOXYzine (ATARAX) 25 MG tablet Take 1 tablet by mouth 3 times daily as needed for Anxiety 20 tablet 0   . cyclobenzaprine (FLEXERIL) 10 MG tablet Take 1 tablet by mouth every 8 hours as needed (prn) Sedation precautions 180 tablet 2   .  FLUoxetine (PROZAC) 10 MG capsule Take 1 capsule by mouth daily 90 capsule 2   . LORazepam (ATIVAN) 0.5 MG tablet Take 1 tablet by mouth 2 times daily as needed for Anxiety . 60 tablet 0   . meloxicam (MOBIC) 15 MG tablet Take 1 tablet by mouth daily 90 tablet 2   . rizatriptan (MAXALT) 10 MG tablet Take 10 mg by mouth once as needed for Migraine May repeat in 2 hours if needed     . metoclopramide (REGLAN) 10 MG tablet Take 1 tablet by mouth 4 times daily as needed (nausea) 120 tablet 3   . Ranitidine HCl (ZANTAC 75 PO) Take 150 mg by mouth.     Marland Kitchen albuterol (PROVENTIL) (5 MG/ML) 0.5% nebulizer solution Take 1 mL by nebulization 4 times daily as needed for Wheezing. 120 each 0   . rizatriptan (MAXALT-MLT) 10 MG disintegrating tablet Take 10 mg by mouth once as needed for Migraine. May repeat in 2 hours if needed       No current facility-administered  medications for this visit.        Assessment:    1. Migraine with aura and without status migrainosus, not intractable    2. Generalized anxiety disorder    3. Need for pneumococcal vaccination    4. Snoring    5. Gastroesophageal reflux disease with esophagitis        Plan:    1. Migraine with aura and without status migrainosus, not intractable  Try Zofran as needed for nausea.   - ondansetron (ZOFRAN ODT) 4 MG disintegrating tablet; Take 1 tablet by mouth every 8 hours as needed for Nausea or Vomiting  Dispense: 30 tablet; Refill: 1  - ketorolac (TORADOL) 10 MG tablet; Take 1 tablet by mouth every 6 hours as needed for Pain  Dispense: 20 tablet; Refill: 5  - RABEprazole (ACIPHEX) 20 MG tablet; Take 1 tablet by mouth daily  Dispense: 30 tablet; Refill: 5  - rizatriptan (MAXALT-MLT) 10 MG disintegrating tablet; Take 1 tablet by mouth once as needed for Migraine May repeat in 2 hours if needed  Dispense: 9 tablet; Refill: 5    2. Generalized anxiety disorder  Try BuSpar or Atarax to see if it helps her in so she would not have to use Ativan.  - busPIRone (BUSPAR) 10 MG tablet; Take 1 tablet by mouth 3 times daily as needed (anxiety)  Dispense: 30 tablet; Refill: 1  - hydrOXYzine (ATARAX) 25 MG tablet; Take 1 tablet by mouth 3 times daily as needed for Anxiety  Dispense: 20 tablet; Refill: 0    3. Need for pneumococcal vaccination  - PNEUMOVAX 23 subcutaneous/IM (Pneumococcal polysaccharide vaccine 23-valent >= 2yo)    4. Snoring  Will need sleep study in the future.  Barnett Hatter MD    5. Gastroesophageal reflux disease with esophagitis  Stable. Continue current medications.   - RABEprazole (ACIPHEX) 20 MG tablet; Take 1 tablet by mouth daily  Dispense: 30 tablet; Refill: 5      Return in about 1 month (around 01/03/2017) for Mood disorder.

## 2016-12-29 NOTE — Telephone Encounter (Signed)
The Office received a referral from Dr. Zerita Boers [place] for pt OSA, several messages have been left for her to call back to schedule appointment. She has not responded to any messages at this time.

## 2017-01-06 ENCOUNTER — Ambulatory Visit
Admit: 2017-01-06 | Discharge: 2017-01-06 | Payer: PRIVATE HEALTH INSURANCE | Attending: Family Medicine | Primary: Family Medicine

## 2017-01-06 DIAGNOSIS — F411 Generalized anxiety disorder: Secondary | ICD-10-CM

## 2017-01-06 MED ORDER — LORAZEPAM 0.5 MG PO TABS
0.5 | ORAL_TABLET | Freq: Two times a day (BID) | ORAL | 0 refills | Status: AC | PRN
Start: 2017-01-06 — End: 2017-04-06

## 2017-01-06 MED ORDER — HYDROXYZINE HCL 25 MG PO TABS
25 | ORAL_TABLET | Freq: Three times a day (TID) | ORAL | 3 refills | Status: DC | PRN
Start: 2017-01-06 — End: 2018-10-09

## 2017-01-06 MED ORDER — BUSPIRONE HCL 10 MG PO TABS
10 | ORAL_TABLET | Freq: Three times a day (TID) | ORAL | 3 refills | Status: DC | PRN
Start: 2017-01-06 — End: 2017-10-10

## 2017-01-06 NOTE — Progress Notes (Signed)
Peggy Sanders is a 41 y.o. female    Chief Complaint   Patient presents with   ??? Depression     follow up       HPI:    HPI    Anxiety/depression follow up. Patient has been taking Buspar in the morning and Atarax at night. It is helping. Previously, she was using the Ativan 3-4 times per week and now she is using it 1-2 times per week. Minimal side effects. Used to see previous PCP every 6 months. Now stressful season will be over in 3 months.    Migraines are stable. Using maxalt prn which helps. Zofran does help with nausea.     ROS:    ROS    BP 102/74 (Site: Left Arm, Position: Sitting, Cuff Size: Large Adult)    Pulse 94    Wt 253 lb (114.8 kg)    SpO2 95%    Breastfeeding? No    BMI 43.43 kg/m??     Physical Exam:    Physical Exam   Constitutional: She appears well-developed. No distress.   Skin: She is not diaphoretic.   Psychiatric: She has a normal mood and affect. Her behavior is normal.       Current Outpatient Prescriptions   Medication Sig Dispense Refill   ??? ondansetron (ZOFRAN ODT) 4 MG disintegrating tablet Take 1 tablet by mouth every 8 hours as needed for Nausea or Vomiting 30 tablet 1   ??? busPIRone (BUSPAR) 10 MG tablet Take 1 tablet by mouth 3 times daily as needed (anxiety) 30 tablet 1   ??? ketorolac (TORADOL) 10 MG tablet Take 1 tablet by mouth every 6 hours as needed for Pain 20 tablet 5   ??? RABEprazole (ACIPHEX) 20 MG tablet Take 1 tablet by mouth daily 30 tablet 5   ??? albuterol sulfate HFA (PROVENTIL HFA) 108 (90 Base) MCG/ACT inhaler INHALE 2 PUFFS BY MOUTH INTO THE LUNGS EVERY 6 HOURS AS NEEDED FOR WHEEZING 1 Inhaler 11   ??? hydrOXYzine (ATARAX) 25 MG tablet Take 1 tablet by mouth 3 times daily as needed for Anxiety 20 tablet 0   ??? cyclobenzaprine (FLEXERIL) 10 MG tablet Take 1 tablet by mouth every 8 hours as needed (prn) Sedation precautions 180 tablet 2   ??? FLUoxetine (PROZAC) 10 MG capsule Take 1 capsule by mouth daily 90 capsule 2   ??? LORazepam (ATIVAN) 0.5 MG tablet Take 1  tablet by mouth 2 times daily as needed for Anxiety . 60 tablet 0   ??? meloxicam (MOBIC) 15 MG tablet Take 1 tablet by mouth daily 90 tablet 2   ??? rizatriptan (MAXALT) 10 MG tablet Take 10 mg by mouth once as needed for Migraine May repeat in 2 hours if needed     ??? metoclopramide (REGLAN) 10 MG tablet Take 1 tablet by mouth 4 times daily as needed (nausea) 120 tablet 3   ??? Ranitidine HCl (ZANTAC 75 PO) Take 150 mg by mouth.     ??? albuterol (PROVENTIL) (5 MG/ML) 0.5% nebulizer solution Take 1 mL by nebulization 4 times daily as needed for Wheezing. 120 each 0   ??? rizatriptan (MAXALT-MLT) 10 MG disintegrating tablet Take 1 tablet by mouth once as needed for Migraine May repeat in 2 hours if needed 9 tablet 5   ??? rizatriptan (MAXALT-MLT) 10 MG disintegrating tablet Take 10 mg by mouth once as needed for Migraine. May repeat in 2 hours if needed       No  current facility-administered medications for this visit.        Assessment:    1. Generalized anxiety disorder    2. Anxiety    3. Migraine with aura and without status migrainosus, not intractable        Plan:    1. Generalized anxiety disorder  Stable. Continue current medications.   - hydrOXYzine (ATARAX) 25 MG tablet; Take 1 tablet by mouth 3 times daily as needed for Anxiety  Dispense: 30 tablet; Refill: 3  - busPIRone (BUSPAR) 10 MG tablet; Take 1 tablet by mouth 3 times daily as needed (anxiety)  Dispense: 30 tablet; Refill: 3    2. Anxiety  - LORazepam (ATIVAN) 0.5 MG tablet; Take 1 tablet by mouth 2 times daily as needed for Anxiety for up to 90 days..  Dispense: 30 tablet; Refill: 0    3. Migraine with aura and without status migrainosus, not intractable  Stable. Continue current medications.       Return in about 6 months (around 07/08/2017) for Mood disorder.

## 2017-03-15 NOTE — Telephone Encounter (Signed)
Last office visit 01/06/2017     Last written 08/06/16    Next office visit scheduled  No appt scheduled     Requested Prescriptions      No prescriptions requested or ordered in this encounter

## 2017-03-16 MED ORDER — FLUOXETINE HCL 10 MG PO CAPS
10 | ORAL_CAPSULE | Freq: Every day | ORAL | 1 refills | Status: DC
Start: 2017-03-16 — End: 2017-12-22

## 2017-06-21 ENCOUNTER — Ambulatory Visit
Admit: 2017-06-21 | Discharge: 2017-06-21 | Payer: PRIVATE HEALTH INSURANCE | Attending: Family Medicine | Primary: Family Medicine

## 2017-06-21 DIAGNOSIS — M19049 Primary osteoarthritis, unspecified hand: Secondary | ICD-10-CM

## 2017-06-21 MED ORDER — NAPROXEN 500 MG PO TABS
500 MG | ORAL_TABLET | Freq: Two times a day (BID) | ORAL | 3 refills | Status: DC
Start: 2017-06-21 — End: 2018-05-05

## 2017-06-21 MED ORDER — PREDNISONE 20 MG PO TABS
20 MG | ORAL_TABLET | ORAL | 0 refills | Status: DC
Start: 2017-06-21 — End: 2017-07-26

## 2017-06-21 NOTE — Progress Notes (Signed)
Peggy Sanders is a 41 y.o. female    Chief Complaint   Patient presents with   . Swelling     both hands, inflammed    . Hand Pain     both hands       HPI:    Hand Pain    The incident occurred more than 1 week ago. The pain is present in the left hand and right hand. The quality of the pain is described as aching. The pain is moderate. The pain has been worsening since the incident. She has tried NSAIDs for the symptoms. The treatment provided mild relief.       ROS:    Review of Systems   Musculoskeletal: Positive for joint pain.       BP 134/78   Pulse 85   Wt 259 lb (117.5 kg)   SpO2 97%   Breastfeeding? No   BMI 44.46 kg/m     Physical Exam:    Physical Exam   Constitutional: She appears well-developed. No distress.   Skin: She is not diaphoretic.   Psychiatric: She has a normal mood and affect.   Diffuse joint tenderness/mild swelling of the fingers bilaterally.     Current Outpatient Prescriptions   Medication Sig Dispense Refill   . predniSONE (DELTASONE) 20 MG tablet Take 3 tablets daily for 3 days, then 2 tablets daily for 3 days, then 1 tablet daily for 3 days 18 tablet 0   . naproxen (NAPROSYN) 500 MG tablet Take 1 tablet by mouth 2 times daily (with meals) Prn pain 60 tablet 3   . FLUoxetine (PROZAC) 10 MG capsule Take 1 capsule by mouth daily 90 capsule 1   . hydrOXYzine (ATARAX) 25 MG tablet Take 1 tablet by mouth 3 times daily as needed for Anxiety 30 tablet 3   . busPIRone (BUSPAR) 10 MG tablet Take 1 tablet by mouth 3 times daily as needed (anxiety) 30 tablet 3   . ondansetron (ZOFRAN ODT) 4 MG disintegrating tablet Take 1 tablet by mouth every 8 hours as needed for Nausea or Vomiting 30 tablet 1   . ketorolac (TORADOL) 10 MG tablet Take 1 tablet by mouth every 6 hours as needed for Pain 20 tablet 5   . RABEprazole (ACIPHEX) 20 MG tablet Take 1 tablet by mouth daily 30 tablet 5   . albuterol sulfate HFA (PROVENTIL HFA) 108 (90 Base) MCG/ACT inhaler INHALE 2 PUFFS BY MOUTH INTO THE  LUNGS EVERY 6 HOURS AS NEEDED FOR WHEEZING 1 Inhaler 11   . cyclobenzaprine (FLEXERIL) 10 MG tablet Take 1 tablet by mouth every 8 hours as needed (prn) Sedation precautions 180 tablet 2   . meloxicam (MOBIC) 15 MG tablet Take 1 tablet by mouth daily 90 tablet 2   . rizatriptan (MAXALT) 10 MG tablet Take 10 mg by mouth once as needed for Migraine May repeat in 2 hours if needed     . metoclopramide (REGLAN) 10 MG tablet Take 1 tablet by mouth 4 times daily as needed (nausea) 120 tablet 3   . Ranitidine HCl (ZANTAC 75 PO) Take 150 mg by mouth.     Marland Kitchen albuterol (PROVENTIL) (5 MG/ML) 0.5% nebulizer solution Take 1 mL by nebulization 4 times daily as needed for Wheezing. 120 each 0   . rizatriptan (MAXALT-MLT) 10 MG disintegrating tablet Take 1 tablet by mouth once as needed for Migraine May repeat in 2 hours if needed 9 tablet 5   . rizatriptan (MAXALT-MLT) 10  MG disintegrating tablet Take 10 mg by mouth once as needed for Migraine. May repeat in 2 hours if needed       No current facility-administered medications for this visit.        Assessment:    1. Hand arthritis        Plan:    1. Hand arthritis  Check for rheumatoid arthritis. Try steroid taper. Also check x-rays.  - ANA  - RHEUMATOID FACTOR  - CYCLIC CITRUL PEPTIDE ANTIBODY, IGG  - predniSONE (DELTASONE) 20 MG tablet; Take 3 tablets daily for 3 days, then 2 tablets daily for 3 days, then 1 tablet daily for 3 days  Dispense: 18 tablet; Refill: 0  - XR HAND LEFT (2 VIEWS); Future  - XR HAND RIGHT (2 VIEWS); Future  - naproxen (NAPROSYN) 500 MG tablet; Take 1 tablet by mouth 2 times daily (with meals) Prn pain  Dispense: 60 tablet; Refill: 3      Patient to return to clinic if symptoms worsen or fail to improve.

## 2017-06-22 ENCOUNTER — Encounter

## 2017-06-22 LAB — ANA
ANA Titer: 1:160 {titer}
ANA: POSITIVE — AB

## 2017-06-22 LAB — RHEUMATOID FACTOR: Rheumatoid Factor: <10 IU/mL (ref <14)

## 2017-06-23 LAB — CYCLIC CITRUL PEPTIDE ANTIBODY, IGG: CCP IgG Antibodies: 3 Units (ref 0–19)

## 2017-07-13 ENCOUNTER — Encounter

## 2017-07-13 ENCOUNTER — Ambulatory Visit
Admit: 2017-07-13 | Discharge: 2017-07-13 | Payer: PRIVATE HEALTH INSURANCE | Attending: Rheumatology | Primary: Family Medicine

## 2017-07-13 DIAGNOSIS — R768 Other specified abnormal immunological findings in serum: Secondary | ICD-10-CM

## 2017-07-13 LAB — HEPATITIS C ANTIBODY: Hep C Ab Interp: NONREACTIVE

## 2017-07-13 LAB — URINALYSIS
Bilirubin Urine: NEGATIVE
Glucose, Ur: NEGATIVE mg/dL
Ketones, Urine: NEGATIVE mg/dL
Leukocyte Esterase, Urine: NEGATIVE
Nitrite, Urine: NEGATIVE
Protein, UA: NEGATIVE mg/dL
Specific Gravity, UA: 1.026 (ref 1.005–1.030)
Urobilinogen, Urine: 0.2 E.U./dL (ref <2.0)
pH, UA: 6 (ref 5.0–8.0)

## 2017-07-13 LAB — MICROSCOPIC URINALYSIS
Epithelial Cells, UA: 1 /HPF (ref 0–5)
Hyaline Casts, UA: 1 /LPF (ref 0–8)
RBC, UA: 4 /HPF (ref 0–4)
WBC, UA: 1 /HPF (ref 0–5)

## 2017-07-13 LAB — C3 COMPLEMENT: C3 Complement: 180.5 mg/dL — ABNORMAL HIGH (ref 90.0–180.0)

## 2017-07-13 LAB — HEPATITIS B SURFACE ANTIGEN: Hep B S Ag Interp: NONREACTIVE

## 2017-07-13 LAB — HEPATITIS B CORE ANTIBODY, IGM: Hep B Core Ab, IgM: NONREACTIVE

## 2017-07-13 LAB — C-REACTIVE PROTEIN: CRP: 13.1 mg/L — ABNORMAL HIGH (ref 0.0–5.1)

## 2017-07-13 LAB — SEDIMENTATION RATE: Sed Rate: 47 mm/Hr — ABNORMAL HIGH (ref 0–20)

## 2017-07-13 LAB — C4 COMPLEMENT: C4 Complement: 21 mg/dL (ref 10.0–40.0)

## 2017-07-13 MED ORDER — PREDNISONE 10 MG PO TABS
10 MG | ORAL_TABLET | ORAL | 0 refills | Status: DC
Start: 2017-07-13 — End: 2017-10-05

## 2017-07-13 NOTE — Progress Notes (Addendum)
[facility] Rheumatology                                                                                                                 Norm Salt, MD                                                           [address] [place], [place] [address]                                                             [phone] (F)      Dear Dr. Arsenio Katz, DO:  Please find Rheumatology assessment.Thank you for giving me the opportunity to be involved in [place] Mail[place]'s care and I look forward following [place] along with you. If you have any questions or concerns please feel free to reach me.    Note is transcribed using voice recognition software. Inadvertent computerized transcription errors may be present.       Patient identification: Peggy Sanders,DOB: Dec 26, 1975,41 y.o.  Sex: female     Assessment / Plan:    [place] was seen today for joint pain.    Diagnoses and all orders for this visit:    ANA positive  -     ENA 2 - Anti SSA & Anti SSB; Future  -     Ena 1 - anti smith & anti RNP; Future  -     Anti-DNA Antibody, Double-Stranded; Future  -     Beta-2 Glycoprotein Antibodies; Future  -     Cardiolipin Antibodies IgG & IgM; Future  -     Lupus Anticoagulant; Future  -     COMPLEMENT COMPONENTS 3 & 4; Future  -     Urinalysis; Future  -     Anti-Scleroderma Antibody; Future  -     Anti Centromere Antibody; Future  -     C-Reactive Protein; Future  -     Sedimentation Rate; Future  -     Hepatitis C Antibody; Future  -     Hepatitis B Surface Antigen; Future  -     Hepatitis B Core Antibody, IgM; Future  -     MISCELLANEOUS LAB TEST #1; Future    Screening for rheumatic disorder  -     ENA 2 - Anti SSA & Anti SSB; Future  -     Ena 1 - anti smith & anti RNP; Future  -     Anti-DNA Antibody, Double-Stranded; Future  -     Beta-2 Glycoprotein Antibodies; Future  -  Cardiolipin Antibodies IgG & IgM;  Future  -     Lupus Anticoagulant; Future  -     COMPLEMENT COMPONENTS 3 & 4; Future  -     Urinalysis; Future  -     Anti-Scleroderma Antibody; Future  -     Anti Centromere Antibody; Future  -     C-Reactive Protein; Future  -     Sedimentation Rate; Future  -     Hepatitis C Antibody; Future  -     Hepatitis B Surface Antigen; Future  -     Hepatitis B Core Antibody, IgM; Future  -     MISCELLANEOUS LAB TEST #1; Future    OSA (obstructive sleep apnea)  -     [facility]- [facility], Samir    Polyarthralgia  -     predniSONE (DELTASONE) 10 MG tablet; Take 1 tab po daily.        History, physical examination, laboratory evaluation is suggestive of systemic autoimmune rheumatic disease-she has polyarthralgias-likely inflammatory, biphasic Raynaud's phenomenon, subjective skin tightness especially in her. ANA 1: 160 nucleolar pattern which is typically seen in scleroderma.  Multiple family members with autoimmune diseases.    Plan-  Obtain further workup as above to evaluate for systemic rheumatic diseases.  Also refer to sleep medicine for evaluation of obstructive sleep apnea.  She has several features including snoring and morning headaches.  Therapeutically, 10 mg of prednisone in  meantime for polyarthralgias.  We'll reassess in couple of weeks time to go over test results and formulate further plan.  Importance of smoking cessation was also discussed with patient and her partner.    Patient indicates understanding and agrees with the management plan.  I reviewed patient's history, referral documents and electronic medical records including office visit notes from primary provider-Dr. Zerita BoersKoros 06/21/17- hand arthritis- rheumatology workup was initiated and prednisone was given.    Copy of consult note is being routed electronically/faxed to referring physician.  #######################################################################    REASON FOR CONSULTATION:  Patient is being seen at the request of  Sotirios  Koros, DO for evaluation of polyarthralgias and positive ANA.    HISTORY OF PRESENT ILLNESS:  41 y.o. female has been experiencing joint pain for last 1 year, Worse in last 6 months.  Affected joints are symmetric joints across MCPs, PIPs, wrist, and ankle and feet joints-dull pain associated with stiffness, sometimes is not able to make fist.  When she was seen by her primary care physician, she was not able to make fist, finger joints were swollen, was achy all over, was given prednisone taper, which made all of her symptoms go away.  Knees crack and pop.  She also reports biphasic Raynaud's phenomena espcially in  fingers and toes without any tissue loss.  She does not recall any precipitating factors.  Lately, calf area feels tight and stiff.  She snores at night, very tired during the day, also gets daily morning headaches.  She is a chronic smoker, describes intermittent cough.  Denies any focal muscle weakness.   Multiple family members with autoimmune rheumatic diseases.  Lab showed positive ANA, negative RF and CCP.    Pertinent ROS: Denies weight loss, objective fever, skin rashes or skin thickening, photosensitivity, focal alopecia, recurrent ocular congestion, dry eyes or mouth, or mucosal ulcers, tinnitus or recent hearing loss. Denies chest pain, palpitations,  pleurisy, dysphagia, or features of inflammatory bowel diseases.  No h/o blood clots or bleeding disorders. No renal or genitourinary  problems. No focal weakness or persistent paresthesia.  All other ROS are negative.    Past Medical History:   Diagnosis Date   . Cervical intraepithelial neoplasia grade III with severe dysplasia 2008    cervical conization, follow up normal x 2 years--pap smear every 6 months   . GERD (gastroesophageal reflux disease)    . History of endometriosis 2008    excised in 2008   . History of ovarian cyst 2008    multiple cystectomies   . History of trichomoniasis 2008   . PCOS (polycystic ovarian syndrome)      previously on metformin--stopped secondary to side effects     Past Surgical History:   Procedure Laterality Date   . ADENOIDECTOMY     . CERVIX SURGERY     . DILATION AND CURETTAGE  1993   . ENDOMETRIAL ABLATION  2008   . INNER EAR SURGERY      tubes inserted   . TUBAL LIGATION         FH- Mother- RA, Lichen planus, Grandmother- Scleroderma, Father- ILD.    Current Outpatient Prescriptions   Medication Sig Dispense Refill   . predniSONE (DELTASONE) 10 MG tablet Take 1 tab po daily. 30 tablet 0   . naproxen (NAPROSYN) 500 MG tablet Take 1 tablet by mouth 2 times daily (with meals) Prn pain 60 tablet 3   . FLUoxetine (PROZAC) 10 MG capsule Take 1 capsule by mouth daily 90 capsule 1   . hydrOXYzine (ATARAX) 25 MG tablet Take 1 tablet by mouth 3 times daily as needed for Anxiety 30 tablet 3   . busPIRone (BUSPAR) 10 MG tablet Take 1 tablet by mouth 3 times daily as needed (anxiety) 30 tablet 3   . ondansetron (ZOFRAN ODT) 4 MG disintegrating tablet Take 1 tablet by mouth every 8 hours as needed for Nausea or Vomiting 30 tablet 1   . ketorolac (TORADOL) 10 MG tablet Take 1 tablet by mouth every 6 hours as needed for Pain 20 tablet 5   . RABEprazole (ACIPHEX) 20 MG tablet Take 1 tablet by mouth daily 30 tablet 5   . albuterol sulfate HFA (PROVENTIL HFA) 108 (90 Base) MCG/ACT inhaler INHALE 2 PUFFS BY MOUTH INTO THE LUNGS EVERY 6 HOURS AS NEEDED FOR WHEEZING 1 Inhaler 11   . rizatriptan (MAXALT) 10 MG tablet Take 10 mg by mouth once as needed for Migraine May repeat in 2 hours if needed     . metoclopramide (REGLAN) 10 MG tablet Take 1 tablet by mouth 4 times daily as needed (nausea) 120 tablet 3   . Ranitidine HCl (ZANTAC 75 PO) Take 150 mg by mouth.     Marland Kitchen albuterol (PROVENTIL) (5 MG/ML) 0.5% nebulizer solution Take 1 mL by nebulization 4 times daily as needed for Wheezing. 120 each 0   . predniSONE (DELTASONE) 20 MG tablet Take 3 tablets daily for 3 days, then 2 tablets daily for 3 days, then 1 tablet daily for 3 days  18 tablet 0   . rizatriptan (MAXALT-MLT) 10 MG disintegrating tablet Take 1 tablet by mouth once as needed for Migraine May repeat in 2 hours if needed 9 tablet 5   . cyclobenzaprine (FLEXERIL) 10 MG tablet Take 1 tablet by mouth every 8 hours as needed (prn) Sedation precautions 180 tablet 2   . meloxicam (MOBIC) 15 MG tablet Take 1 tablet by mouth daily 90 tablet 2   . rizatriptan (MAXALT-MLT) 10 MG disintegrating tablet Take 10 mg  by mouth once as needed for Migraine. May repeat in 2 hours if needed       No current facility-administered medications for this visit.      Allergies   Allergen Reactions   . Esomeprazole Magnesium Swelling   . Lansoprazole Swelling   . Metformin And Related      Fatigue     . Protonix [Pantoprazole Sodium] Swelling   . Tylenol With Codeine #3 [Acetaminophen-Codeine]      Pt states she passed out when she took the medication.       PHYSICAL EXAM:    Vitals:    BP 126/84 (Site: Right Upper Arm, Position: Sitting, Cuff Size: Large Adult)   Pulse 82   Temp 98.4 F (36.9 C) (Oral)   Ht 5\' 4"  (1.626 m)   Wt 265 lb (120.2 kg)   BMI 45.49 kg/m   General appearance/ Psychiatric: well nourished, and well groomed, normal judgement, alert, appears stated age and cooperative.   MKS:   Subjective  arthralgia across  MCPs and PIP, I do not appreciate any tender, swollen, inflamed joints in upper and lower ext, FROM in all peripheral joints. Normal muscle strength in upper and lower Extremities.    Skin: Skin in her legs below knee and above ankle- subjective tightness,  Feels somewhat tight esp in her calves.  She does not have any overt scleroderma skin changes.  full fist bilaterally.  I could not appreciate dilated nailfold capillaries.  Normal radial pulses and dorsalis pedis pulses, no distal ischemia or scar seen.   No rashes, no induration or skin thickening or nodules. No evidence ischemia or deformities noted in digits or nails.  HEENT: Normal lids, lacrimal glands and pupils. No  oral or nasal ulcers. Salivary glands reveal no evidence of abnormality. External inspection of the ears and nose within normal limits.  Neck: No masses or asymmetry. No thyroid enlargement.  Chest: Normal effort, clear to auscultation.  Heart:  Normal s1/s2, no leg edema.  Abdomen: soft, non-tender. Liver no palpable.   Lymph nodes: No enlargement in cervical, supraclavicular regions.  Neurologic: normal deep tendon reflexes. No foot or wrist drop.          DATA:   Lab Results   Component Value Date    WBC 12.4 (H) 02/24/2015    HGB 14.8 02/24/2015    HCT 42.5 02/24/2015    MCV 90.6 02/24/2015    PLT 292 02/24/2015         Chemistry        Component Value Date/Time    NA 141 05/13/2015 0905    K 4.3 05/13/2015 0905    CL 103 05/13/2015 0905    CO2 24 05/13/2015 0905    BUN 10 05/13/2015 0905    CREATININE 0.6 05/13/2015 0905        Component Value Date/Time    CALCIUM 9.3 05/13/2015 0905    ALKPHOS 68 05/13/2015 0905    AST 12 (L) 05/13/2015 0905    ALT 15 05/13/2015 0905    BILITOT 0.3 05/13/2015 0905          Lab Results   Component Value Date    ANA POSITIVE (A) 06/21/2017     No results found for: CKTOTAL  Lab Results   Component Value Date    TSH 2.17 03/25/2010         A/P- See above.

## 2017-07-15 LAB — ENA 1 - ANTI SMITH & ANTI RNP
Anti-RNP: NEGATIVE EU
ENA Smith (SM) Ab: NEGATIVE EU

## 2017-07-15 LAB — LUPUS ANTICOAGULANT
PLT NEUTA: POSITIVE — AB
PT D: 11.1 s — ABNORMAL LOW (ref 12.0–15.5)
PTT D: 62 s — ABNORMAL HIGH (ref 32–48)
Ptt-D Corr Reflex: 69 s — ABNORMAL HIGH (ref 32–48)
Thrombin Time: 15.8 s (ref 14.7–19.5)
dRVVT Screen: 33 s (ref 33–44)

## 2017-07-15 LAB — ENA 2 - ANTI SSA & ANTI SSB
ENA SSA (RO) Ab: NEGATIVE EU
ENA SSB (LA) Ab: NEGATIVE EU

## 2017-07-15 LAB — BETA-2 GLYCOPROTEIN ANTIBODIES
Beta-2 Glyco 1 IgG: 0 SGU (ref 0–20)
Beta-2 Glyco 1 IgM: 3 SMU (ref 0–20)

## 2017-07-15 LAB — ANTI-SCLERODERMA ANTIBODY: Scleroderma SCL-70: 0 AU/mL (ref 0–40)

## 2017-07-15 LAB — ANTI-DNA ANTIBODY, DOUBLE-STRANDED: Double Stranded Dna Ab, Igg: NOT DETECTED

## 2017-07-15 LAB — ANTI-CENTROMERE AB IGG: Centromere Ab IgG: 1 AU/mL (ref 0–40)

## 2017-07-16 LAB — CARDIOLIPIN ANTIBODIES IGG & IGM
Anticardiolipin IgG: 0 [GPL'U] (ref 0–14)
Cardiolipin Ab IgM: 0 [MPL'U] (ref 0–12)

## 2017-07-19 LAB — MISC ARUP 1

## 2017-07-26 ENCOUNTER — Ambulatory Visit
Admit: 2017-07-26 | Discharge: 2017-07-26 | Payer: PRIVATE HEALTH INSURANCE | Attending: Rheumatology | Primary: Family Medicine

## 2017-07-26 DIAGNOSIS — M359 Systemic involvement of connective tissue, unspecified: Secondary | ICD-10-CM

## 2017-07-26 MED ORDER — FOLIC ACID 1 MG PO TABS
1 MG | ORAL_TABLET | Freq: Every day | ORAL | 1 refills | Status: DC
Start: 2017-07-26 — End: 2018-05-04

## 2017-07-26 MED ORDER — METHOTREXATE 2.5 MG PO TABS
2.5 MG | ORAL_TABLET | ORAL | 0 refills | Status: AC
Start: 2017-07-26 — End: 2017-08-25

## 2017-07-26 NOTE — Progress Notes (Signed)
[facility]                                                                                                                 Norm Saltara J Shanicka Oldenkamp, MD                                                           [address] [place], [place] [id number]                                                             [phone](P) [phone] (F)      Dear Dr. Arsenio KatzSotirios Koros, DO:  Please find Rheumatology assessment.Thank you for giving me the opportunity to be involved in [place] Bartnik's care and I look forward following [place] along with you. If you have any questions or concerns please feel free to reach me.    Note is transcribed using voice recognition software. Inadvertent computerized transcription errors may be present.       Patient identification: Peggy Sanders,DOB: 1976-07-10,41 y.o.  Sex: female     Assessment / Plan:    [place] was seen today for follow-up.    Diagnoses and all orders for this visit:    Undifferentiated connective tissue disease (HCC)    Inflammatory arthritis    High risk medication use  -     AST(SGOT) & ALT(SGPT); Standing  -     CBC Auto Differential; Standing  -     C-Reactive Protein; Standing  -     Creatinine, Serum; Standing  -     Sedimentation Rate; Standing    Other orders  -     methotrexate (RHEUMATREX) 2.5 MG chemo tablet; Week 1: 4 tablets by mouth once a week. Week 2: 5 tablets /week.Week 3: 6 tablets/ week, thereafter: 7 tablets/ week. TAKE ALL PILLS AT ONCE  -     folic acid (FOLVITE) 1 MG tablet; Take 1 tablet by mouth daily Take 1 tab po daily.        Manifestation-inflammatory arthritis and biphasic Raynaud's phenomenon.  Subjective tightness in her calves, no objective findings.  Lab-positive ANA 160 nucleolar pattern, elevated inflammatory markers.  Does not meet criteria for 1 connective tissue disease, would classify her as undifferentiated connective tissue  disease.  She continues to have symptoms across MCPs, PIPs, has not had any flares on prednisone 10 mg a day.    Plan-  Recommend methotrexate as steroid sparing agent.Risk, benefits and side effects fully explained including but not limited the immune suppression, low blood count, liver toxicity, rare lung problems,  probable risk of cancers and impaired fertility and teratogenicity.  She already had tubal ligation. It requires periodic blood monitoring for the side effects and alcohol abstinence is recommended. Patient was made aware that this is a high risk medications, and explained the importance of close follow up and testing in order to identify any adverse events that can potentially cause organ damage.   Patient verbalizes the understanding. I also gave hand out on drug information and should discuss with me with any concerns.  Continue 10 mg of prednisone daily, add methotrexate and folic acid.  Once she feels better, she is advised to reduce prednisone 5 mg daily  For about 1 week and discontinue.  Smoking cessation is discussed.  We also went over common symptoms of lupus spectrum diseases including scleroderma, and she was advised to call me with any worsening joint symptoms or skin findings.  Repeat blood work in one month, office visit in 2 months.    I reviewed patient's history, referral documents and electronic medical records.  Copy of consult note is being routed electronically/faxed to referring physician.  #######################################################################      Subjective-  Follow up for inflammatory polyarthritis, fatigue and positive ANA.  In last visit, I ordered sub-serologies, all sub-serologies were negative.  She also started 10 mg of prednisone, states that she feels little relieving her symptoms, however has significant stiffness, pain, discomfort across MCPs and PIPs.  Has not had any flares on prednisone.  Previously, she used to get flares where she could not  make fist.  Fatigue is persistent.  Subjective skin tightening. Biphasic Raynaud's phenomena espcially in  fingers and toes without any tissue loss.  She does not recall any precipitating factors.   All other ROS are negative.    Past Medical History:   Diagnosis Date   . Cervical intraepithelial neoplasia grade III with severe dysplasia 2008    cervical conization, follow up normal x 2 years--pap smear every 6 months   . GERD (gastroesophageal reflux disease)    . History of endometriosis 2008    excised in 2008   . History of ovarian cyst 2008    multiple cystectomies   . History of trichomoniasis 2008   . PCOS (polycystic ovarian syndrome)     previously on metformin--stopped secondary to side effects     Past Surgical History:   Procedure Laterality Date   . ADENOIDECTOMY     . CERVIX SURGERY     . DILATION AND CURETTAGE  1993   . ENDOMETRIAL ABLATION  2008   . INNER EAR SURGERY      tubes inserted   . TUBAL LIGATION         FH- Mother- RA, Lichen planus, Grandmother- Scleroderma, Father- ILD.    Current Outpatient Prescriptions   Medication Sig Dispense Refill   . methotrexate (RHEUMATREX) 2.5 MG chemo tablet Week 1: 4 tablets by mouth once a week. Week 2: 5 tablets /week.Week 3: 6 tablets/ week, thereafter: 7 tablets/ week. TAKE ALL PILLS AT ONCE 22 tablet 0   . folic acid (FOLVITE) 1 MG tablet Take 1 tablet by mouth daily Take 1 tab po daily. 90 tablet 1   . predniSONE (DELTASONE) 10 MG tablet Take 1 tab po daily. 30 tablet 0   . naproxen (NAPROSYN) 500 MG tablet Take 1 tablet by mouth 2 times daily (with meals) Prn pain 60 tablet 3   . FLUoxetine (PROZAC) 10 MG capsule Take 1 capsule by mouth daily 90  capsule 1   . hydrOXYzine (ATARAX) 25 MG tablet Take 1 tablet by mouth 3 times daily as needed for Anxiety 30 tablet 3   . busPIRone (BUSPAR) 10 MG tablet Take 1 tablet by mouth 3 times daily as needed (anxiety) 30 tablet 3   . ondansetron (ZOFRAN ODT) 4 MG disintegrating tablet Take 1 tablet by mouth every 8  hours as needed for Nausea or Vomiting 30 tablet 1   . ketorolac (TORADOL) 10 MG tablet Take 1 tablet by mouth every 6 hours as needed for Pain 20 tablet 5   . RABEprazole (ACIPHEX) 20 MG tablet Take 1 tablet by mouth daily 30 tablet 5   . albuterol sulfate HFA (PROVENTIL HFA) 108 (90 Base) MCG/ACT inhaler INHALE 2 PUFFS BY MOUTH INTO THE LUNGS EVERY 6 HOURS AS NEEDED FOR WHEEZING 1 Inhaler 11   . cyclobenzaprine (FLEXERIL) 10 MG tablet Take 1 tablet by mouth every 8 hours as needed (prn) Sedation precautions 180 tablet 2   . meloxicam (MOBIC) 15 MG tablet Take 1 tablet by mouth daily 90 tablet 2   . rizatriptan (MAXALT) 10 MG tablet Take 10 mg by mouth once as needed for Migraine May repeat in 2 hours if needed     . metoclopramide (REGLAN) 10 MG tablet Take 1 tablet by mouth 4 times daily as needed (nausea) 120 tablet 3   . Ranitidine HCl (ZANTAC 75 PO) Take 150 mg by mouth.     Marland Kitchen. albuterol (PROVENTIL) (5 MG/ML) 0.5% nebulizer solution Take 1 mL by nebulization 4 times daily as needed for Wheezing. 120 each 0   . rizatriptan (MAXALT-MLT) 10 MG disintegrating tablet Take 1 tablet by mouth once as needed for Migraine May repeat in 2 hours if needed 9 tablet 5   . rizatriptan (MAXALT-MLT) 10 MG disintegrating tablet Take 10 mg by mouth once as needed for Migraine. May repeat in 2 hours if needed       No current facility-administered medications for this visit.      Allergies   Allergen Reactions   . Esomeprazole Magnesium Swelling   . Lansoprazole Swelling   . Metformin And Related      Fatigue     . Protonix [Pantoprazole Sodium] Swelling   . Tylenol With Codeine #3 [Acetaminophen-Codeine]      Pt states she passed out when she took the medication.       PHYSICAL EXAM:    Vitals:    BP 116/78 (Site: Right Upper Arm, Position: Sitting, Cuff Size: Large Adult)   Pulse 72   Temp 98.2 F (36.8 C) (Oral)   Ht 5\' 4"  (1.626 m)   Wt 269 lb (122 kg)   BMI 46.17 kg/m   General appearance/ Psychiatric: well nourished,  and well groomed, normal judgement, alert, appears stated age and cooperative.   MKS:   Subjective arthralgias across MCPs, PIPs bilaterally, loose full fist bilaterally.  I do not appreciate any overtly swollen or inflamed joints.  FROM in all peripheral joints. Normal muscle strength in upper and lower Extremities.    Skin: Skin in her legs below knee and above ankle- subjective tightness,  Feels somewhat tight esp in her calves.  She does not have any overt scleroderma skin changes.  full fist bilaterally.  I could not appreciate dilated nailfold capillaries.  Normal radial pulses and dorsalis pedis pulses, no distal ischemia or scar seen.   No rashes, no induration or skin thickening or nodules. No evidence ischemia or  deformities noted in digits or nails.        DATA:   Lab Results   Component Value Date    WBC 12.4 (H) 02/24/2015    HGB 14.8 02/24/2015    HCT 42.5 02/24/2015    MCV 90.6 02/24/2015    PLT 292 02/24/2015         Chemistry        Component Value Date/Time    NA 141 05/13/2015 0905    K 4.3 05/13/2015 0905    CL 103 05/13/2015 0905    CO2 24 05/13/2015 0905    BUN 10 05/13/2015 0905    CREATININE 0.6 05/13/2015 0905        Component Value Date/Time    CALCIUM 9.3 05/13/2015 0905    ALKPHOS 68 05/13/2015 0905    AST 12 (L) 05/13/2015 0905    ALT 15 05/13/2015 0905    BILITOT 0.3 05/13/2015 0905          Lab Results   Component Value Date    ANA POSITIVE (A) 06/21/2017    SEDRATE 47 (H) 07/13/2017     No results found for: CKTOTAL  Lab Results   Component Value Date    TSH 2.17 03/25/2010         A/P- See above.

## 2017-07-26 NOTE — Patient Instructions (Signed)
Methotrexate instructions-    1. PLEASE TAKE ALL TABLETS AT ONCE OR IF YOU ARE UNABLE TO TOLERATE ALL AT ONCE- CAN SPLIT HALF OF THE DOSE IN THE MORNING AND HALF IN THE EVENING OF THE SAME DAY.         Week 1- 4 tablets by mouth once a week       Week 2- 5 tablets by mouth once a week        Week 3- 6 tablets by mouth once a week       Week 4 onwards take 7 tablets by mouth once a week.    2. Folic acid 1 mg daily as long as you are taking Methotrexate.    3. If you are unable to tolerate full dose of Methotrexate due to nausea/vomiting, diarrhea or other side effects, do not increase the dose instead decrease the number of tablets you are taking once a week.    4.  Blood work monthly  to monitor systemic side effects.    5. IF YOU DO NOT HAVE METHOTREXATE REFILLS< PLEASE CALL OFFICE.        PLEASE CALL WITH ANY QUESTIONS OR CONCERNS

## 2017-09-27 ENCOUNTER — Encounter: Attending: Rheumatology | Primary: Family Medicine

## 2017-10-05 ENCOUNTER — Encounter

## 2017-10-06 MED ORDER — PREDNISONE 5 MG PO TABS
5 MG | ORAL_TABLET | ORAL | 0 refills | Status: DC
Start: 2017-10-06 — End: 2017-10-20

## 2017-10-06 NOTE — Telephone Encounter (Signed)
OV, will not be able to authorize further medication without assessing/

## 2017-10-06 NOTE — Telephone Encounter (Signed)
Last OV 07/26/17  No Future OV Scheduled   Recent Labs 07/26/17

## 2017-10-10 ENCOUNTER — Telehealth

## 2017-10-10 MED ORDER — BUSPIRONE HCL 5 MG PO TABS
5 | ORAL_TABLET | Freq: Three times a day (TID) | ORAL | 5 refills | Status: DC | PRN
Start: 2017-10-10 — End: 2018-10-09

## 2017-10-10 NOTE — Telephone Encounter (Signed)
Asking if the buspirone can be changed from 10 mg to 5 mg. 10 mg is on back order.

## 2017-10-10 NOTE — Telephone Encounter (Signed)
Pharmacy informed.

## 2017-10-10 NOTE — Telephone Encounter (Signed)
Yes, new rx sent.

## 2017-10-20 ENCOUNTER — Ambulatory Visit
Admit: 2017-10-20 | Discharge: 2017-10-20 | Payer: PRIVATE HEALTH INSURANCE | Attending: Family Medicine | Primary: Family Medicine

## 2017-10-20 DIAGNOSIS — M19049 Primary osteoarthritis, unspecified hand: Secondary | ICD-10-CM

## 2017-10-20 MED ORDER — ONDANSETRON HCL 4 MG PO TABS
4 MG | ORAL_TABLET | Freq: Every day | ORAL | 0 refills | Status: DC | PRN
Start: 2017-10-20 — End: 2019-08-19

## 2017-10-20 MED ORDER — PREDNISONE 20 MG PO TABS
20 | ORAL_TABLET | ORAL | 0 refills | Status: DC
Start: 2017-10-20 — End: 2018-02-13

## 2017-10-20 NOTE — Progress Notes (Signed)
Peggy Sanders is a 42 y.o. female    Chief Complaint   Patient presents with   . Hand Pain     bitalteral    . Headache   . Nausea       HPI:    Hand Pain    The incident occurred more than 1 week ago. There was no injury mechanism. The pain is present in the left hand and right hand. The pain has been worsening since the incident. Nothing aggravates the symptoms. Treatments tried: Methotrexate. The treatment provided no relief.   Migraine    This is a new problem. The current episode started today. The problem occurs constantly. The pain is located in the bilateral region. The quality of the pain is described as throbbing. Associated symptoms include nausea. Pertinent negatives include no vomiting. The symptoms are aggravated by bright light. She has tried nothing for the symptoms.     ROS:    Review of Systems   Gastrointestinal: Positive for nausea. Negative for vomiting.   Neurological: Positive for headaches.       BP 124/76   Pulse 118   Wt 278 lb (126.1 kg)   SpO2 98%   BMI 47.72 kg/m     Physical Exam:    Physical Exam   Constitutional: She is oriented to person, place, and time. She appears well-developed. No distress.   Eyes: EOM are normal.   Cardiovascular: Normal rate, regular rhythm and normal heart sounds.    Pulmonary/Chest: Effort normal and breath sounds normal. No respiratory distress.   Musculoskeletal:        Right hand: She exhibits tenderness (diffusely over the joints).        Left hand: She exhibits tenderness (diffusely over the joints).   Neurological: She is alert and oriented to person, place, and time.   Skin: She is not diaphoretic.   Psychiatric: She has a normal mood and affect. Her behavior is normal.       Current Outpatient Prescriptions   Medication Sig Dispense Refill   . predniSONE (DELTASONE) 20 MG tablet Take 3 tablets daily for 3 days, then 2 tablets daily for 3 days, then 1 tablet daily for 3 days 18 tablet 0   . ondansetron (ZOFRAN) 4 MG tablet Take 1 tablet by  mouth daily as needed for Nausea or Vomiting 30 tablet 0   . busPIRone (BUSPAR) 5 MG tablet Take 2 tablets by mouth 3 times daily as needed (anxiety) 60 tablet 5   . folic acid (FOLVITE) 1 MG tablet Take 1 tablet by mouth daily Take 1 tab po daily. 90 tablet 1   . naproxen (NAPROSYN) 500 MG tablet Take 1 tablet by mouth 2 times daily (with meals) Prn pain 60 tablet 3   . FLUoxetine (PROZAC) 10 MG capsule Take 1 capsule by mouth daily 90 capsule 1   . hydrOXYzine (ATARAX) 25 MG tablet Take 1 tablet by mouth 3 times daily as needed for Anxiety 30 tablet 3   . ondansetron (ZOFRAN ODT) 4 MG disintegrating tablet Take 1 tablet by mouth every 8 hours as needed for Nausea or Vomiting 30 tablet 1   . ketorolac (TORADOL) 10 MG tablet Take 1 tablet by mouth every 6 hours as needed for Pain 20 tablet 5   . RABEprazole (ACIPHEX) 20 MG tablet Take 1 tablet by mouth daily 30 tablet 5   . albuterol sulfate HFA (PROVENTIL HFA) 108 (90 Base) MCG/ACT inhaler INHALE 2 PUFFS BY MOUTH  INTO THE LUNGS EVERY 6 HOURS AS NEEDED FOR WHEEZING 1 Inhaler 11   . cyclobenzaprine (FLEXERIL) 10 MG tablet Take 1 tablet by mouth every 8 hours as needed (prn) Sedation precautions 180 tablet 2   . meloxicam (MOBIC) 15 MG tablet Take 1 tablet by mouth daily 90 tablet 2   . rizatriptan (MAXALT) 10 MG tablet Take 10 mg by mouth once as needed for Migraine May repeat in 2 hours if needed     . metoclopramide (REGLAN) 10 MG tablet Take 1 tablet by mouth 4 times daily as needed (nausea) 120 tablet 3   . Ranitidine HCl (ZANTAC 75 PO) Take 150 mg by mouth.     Marland Kitchen. albuterol (PROVENTIL) (5 MG/ML) 0.5% nebulizer solution Take 1 mL by nebulization 4 times daily as needed for Wheezing. 120 each 0   . rizatriptan (MAXALT-MLT) 10 MG disintegrating tablet Take 1 tablet by mouth once as needed for Migraine May repeat in 2 hours if needed 9 tablet 5     No current facility-administered medications for this visit.        Assessment:    1. Hand arthritis    2. Migraine  without aura and without status migrainosus, not intractable        Plan:    1. Hand arthritis  Try the steroid taper with food. F/u with Rheum for other therapy.   - predniSONE (DELTASONE) 20 MG tablet; Take 3 tablets daily for 3 days, then 2 tablets daily for 3 days, then 1 tablet daily for 3 days  Dispense: 18 tablet; Refill: 0    2. Migraine without aura and without status migrainosus, not intractable  Try Zofran for the nausea. Has Toradol and Maxalt at home.   - ondansetron (ZOFRAN) 4 MG tablet; Take 1 tablet by mouth daily as needed for Nausea or Vomiting  Dispense: 30 tablet; Refill: 0    Patient to return to clinic if symptoms worsen or fail to improve.

## 2017-11-10 ENCOUNTER — Ambulatory Visit
Admit: 2017-11-10 | Discharge: 2017-11-10 | Payer: PRIVATE HEALTH INSURANCE | Attending: Family Medicine | Primary: Family Medicine

## 2017-11-10 DIAGNOSIS — J029 Acute pharyngitis, unspecified: Secondary | ICD-10-CM

## 2017-11-10 LAB — POCT RAPID STREP A: Strep A Ag: NOT DETECTED

## 2017-11-10 MED ORDER — MECLIZINE HCL 25 MG PO TABS
25 MG | ORAL_TABLET | Freq: Three times a day (TID) | ORAL | 0 refills | Status: AC | PRN
Start: 2017-11-10 — End: ?

## 2017-11-10 MED ORDER — AMOXICILLIN-POT CLAVULANATE ER 1000-62.5 MG PO TB12
1000-62.5 | ORAL_TABLET | Freq: Two times a day (BID) | ORAL | 0 refills | Status: AC
Start: 2017-11-10 — End: 2017-11-20

## 2017-11-10 NOTE — Progress Notes (Signed)
Peggy Sanders is a 42 y.o. female    Chief Complaint   Patient presents with   . Pharyngitis   . Otalgia     both, right is worse   . Dizziness       HPI:    Pharyngitis   This is a new problem. The current episode started in the past 7 days. The problem occurs constantly. The problem has been gradually worsening. Associated symptoms include a fever. Associated symptoms comments: Ear pain bilaterally, dizzy. The symptoms are aggravated by swallowing. Treatments tried: nyquil. The treatment provided mild relief.       ROS:    Review of Systems   Constitutional: Positive for fever.       BP 112/82   Pulse 110   Temp 98.2 F (36.8 C) (Oral)   Wt 275 lb (124.7 kg)   SpO2 92%   BMI 47.20 kg/m     Physical Exam:    Physical Exam   Constitutional: She appears well-developed and well-nourished.   HENT:   Right Ear: A middle ear effusion is present.   Mouth/Throat: No posterior oropharyngeal edema or posterior oropharyngeal erythema.   Cardiovascular: Normal rate and regular rhythm.    No murmur heard.  Pulmonary/Chest: Effort normal and breath sounds normal.   Neurological: She is alert.   Psychiatric: She has a normal mood and affect.       Current Outpatient Prescriptions   Medication Sig Dispense Refill   . amoxicillin-clavulanate (AUGMENTIN XR) 1000-62.5 MG per extended release tablet Take 1 tablet by mouth 2 times daily for 10 days 20 tablet 0   . meclizine (ANTIVERT) 25 MG tablet Take 0.5-1 tablets by mouth 3 times daily as needed for Dizziness 30 tablet 0   . predniSONE (DELTASONE) 20 MG tablet Take 3 tablets daily for 3 days, then 2 tablets daily for 3 days, then 1 tablet daily for 3 days 18 tablet 0   . ondansetron (ZOFRAN) 4 MG tablet Take 1 tablet by mouth daily as needed for Nausea or Vomiting 30 tablet 0   . busPIRone (BUSPAR) 5 MG tablet Take 2 tablets by mouth 3 times daily as needed (anxiety) 60 tablet 5   . folic acid (FOLVITE) 1 MG tablet Take 1 tablet by mouth daily Take 1 tab po daily. 90  tablet 1   . naproxen (NAPROSYN) 500 MG tablet Take 1 tablet by mouth 2 times daily (with meals) Prn pain 60 tablet 3   . FLUoxetine (PROZAC) 10 MG capsule Take 1 capsule by mouth daily 90 capsule 1   . hydrOXYzine (ATARAX) 25 MG tablet Take 1 tablet by mouth 3 times daily as needed for Anxiety 30 tablet 3   . ondansetron (ZOFRAN ODT) 4 MG disintegrating tablet Take 1 tablet by mouth every 8 hours as needed for Nausea or Vomiting 30 tablet 1   . ketorolac (TORADOL) 10 MG tablet Take 1 tablet by mouth every 6 hours as needed for Pain 20 tablet 5   . RABEprazole (ACIPHEX) 20 MG tablet Take 1 tablet by mouth daily 30 tablet 5   . albuterol sulfate HFA (PROVENTIL HFA) 108 (90 Base) MCG/ACT inhaler INHALE 2 PUFFS BY MOUTH INTO THE LUNGS EVERY 6 HOURS AS NEEDED FOR WHEEZING 1 Inhaler 11   . cyclobenzaprine (FLEXERIL) 10 MG tablet Take 1 tablet by mouth every 8 hours as needed (prn) Sedation precautions 180 tablet 2   . meloxicam (MOBIC) 15 MG tablet Take 1 tablet by mouth  daily 90 tablet 2   . rizatriptan (MAXALT) 10 MG tablet Take 10 mg by mouth once as needed for Migraine May repeat in 2 hours if needed     . metoclopramide (REGLAN) 10 MG tablet Take 1 tablet by mouth 4 times daily as needed (nausea) 120 tablet 3   . Ranitidine HCl (ZANTAC 75 PO) Take 150 mg by mouth.     Marland Kitchen. albuterol (PROVENTIL) (5 MG/ML) 0.5% nebulizer solution Take 1 mL by nebulization 4 times daily as needed for Wheezing. 120 each 0   . rizatriptan (MAXALT-MLT) 10 MG disintegrating tablet Take 1 tablet by mouth once as needed for Migraine May repeat in 2 hours if needed 9 tablet 5     No current facility-administered medications for this visit.        Assessment:    1. Sore throat    2. Dizziness        Plan:    1. Sore throat  Likely more sinusitis in nature.   - POCT rapid strep A negative.   - amoxicillin-clavulanate (AUGMENTIN XR) 1000-62.5 MG per extended release tablet; Take 1 tablet by mouth 2 times daily for 10 days  Dispense: 20 tablet;  Refill: 0    2. Dizziness  Likely from sinus/ear issues.   - meclizine (ANTIVERT) 25 MG tablet; Take 0.5-1 tablets by mouth 3 times daily as needed for Dizziness  Dispense: 30 tablet; Refill: 0      Patient to return to clinic if symptoms worsen or fail to improve.

## 2017-12-22 ENCOUNTER — Encounter

## 2017-12-23 MED ORDER — KETOROLAC TROMETHAMINE 10 MG PO TABS
10 MG | ORAL_TABLET | ORAL | 4 refills | Status: DC
Start: 2017-12-23 — End: 2018-10-09

## 2017-12-23 MED ORDER — FLUOXETINE HCL 10 MG PO CAPS
10 MG | ORAL_CAPSULE | ORAL | 0 refills | Status: DC
Start: 2017-12-23 — End: 2018-06-13

## 2017-12-23 NOTE — Telephone Encounter (Signed)
.    Last office visit 11/10/17     Last written     Next office visit scheduled none    Requested Prescriptions     Pending Prescriptions Disp Refills   ??? ketorolac (TORADOL) 10 MG tablet [Pharmacy Med Name: KETOROLAC 10 MG TABLET] 20 tablet 4     Sig: TAKE ONE TABLET BY MOUTH EVERY 6 HOURS AS NEEDED FOR PAIN   ??? FLUoxetine (PROZAC) 10 MG capsule [Pharmacy Med Name: FLUoxetine HCL 10 MG CAPSULE] 90 capsule 0     Sig: TAKE ONE CAPSULE BY MOUTH DAILY

## 2018-02-13 ENCOUNTER — Telehealth

## 2018-02-13 MED ORDER — PREDNISONE 20 MG PO TABS
20 MG | ORAL_TABLET | ORAL | 0 refills | Status: DC
Start: 2018-02-13 — End: 2018-03-20

## 2018-02-13 NOTE — Telephone Encounter (Signed)
Pt called w/ c/o sig hand swelling and itch.  She stated she has a hx of connective tissue dz.  Typically responds to steroids.  rx sent to pharmacy.  Pt to call  pcp this week, esp if not improved.

## 2018-03-20 ENCOUNTER — Encounter

## 2018-03-20 MED ORDER — PREDNISONE 20 MG PO TABS
20 MG | ORAL_TABLET | ORAL | 0 refills | Status: DC
Start: 2018-03-20 — End: 2018-04-11

## 2018-03-20 NOTE — Telephone Encounter (Signed)
Patient requesting a medication refill.  Medication: predniSONE 20 MG Oral Tablet  [facility]: [facility] [address] - [place], [place] [address] [place] [phone] [place]- F [phone]  Last office visit: 11/10/2017  Next office visit: Visit date not found      Pt was triaged by RN, pt is experiencing bilateral hand swelling x1day getting worse. Pt stated to nurse that when this occurs there is normally a steroid called in to pharmacy for pt. Pt is hoping for this to be possibly fulfilled today. Please advise.

## 2018-03-20 NOTE — Telephone Encounter (Signed)
.    Last office visit 11/10/17     Last written 02/13/18 #20 no refills    Next office visit scheduled none    Requested Prescriptions     Pending Prescriptions Disp Refills   ??? predniSONE (DELTASONE) 20 MG tablet [Pharmacy Med Name: predniSONE 20 MG TABLET] 18 tablet 0     Sig: TAKE THREE TABLETS BY MOUTH DAILY FOR 3 DAYS THEN TAKE TWO TABLETS BY MOUTH DAILY FOR 3 DAYS THEN TAKE ONE TABLET BY MOUTH DAILY FOR 3 DAYS

## 2018-03-20 NOTE — Telephone Encounter (Signed)
Reason for Disposition  ??? MODERATE hand swelling (e.g., visible swelling of hand and fingers; pitting edema)    Protocols used: HAND SWELLING-ADULT-AH    Pt calls with chief c/o bilateral hand swelling, itching and pain.  Patient states that most of these symptoms are on the underside of her hand, where the fingers meet the hand.  She rates the pain 7/10, the hands are also red. Denies SOB. Pt states this happens every few months and it is r/t her connective tissue disorder. She is typically put on steroids and it resolves quickly.    Caller with symptoms as documented above. Caller informed of disposition.  Soft transfer to [facility] - message to be sent back to office, per pt request, to request if order for a steroid can be called in for the pt.  Care advice as documented.

## 2018-04-11 ENCOUNTER — Ambulatory Visit
Admit: 2018-04-11 | Discharge: 2018-04-11 | Payer: PRIVATE HEALTH INSURANCE | Attending: Nurse Practitioner | Primary: Family Medicine

## 2018-04-11 DIAGNOSIS — M359 Systemic involvement of connective tissue, unspecified: Secondary | ICD-10-CM

## 2018-04-11 MED ORDER — PREDNISONE 20 MG PO TABS
20 MG | ORAL_TABLET | ORAL | 0 refills | Status: DC
Start: 2018-04-11 — End: 2018-05-04

## 2018-04-11 NOTE — Progress Notes (Signed)
Peggy Sanders 42 y.o. female    Chief Complaint   Patient presents with   ??? Edema     bilat hands- itchy, swelling- would like a new referral and steroid pack       HPI     B hand itching, pain, swelling, some redness.   "same symptoms as always"    +hx of undifferentiated connective tissue disease, inflammatory arthritis. ?Raynaud's   Saw dr.Adhikari, was recommended methotrexate, folic acid- caused vomiting, has been taking prednisone intermittently. Requesting referral to a new rheumatology.     Continues to smoke, not willing to quit.  Does not sleep, snores, stops breathing.     Pastmedical, surgical, family and social history were reviewed and updated with the patient.      Current Outpatient Medications:   ???  predniSONE (DELTASONE) 20 MG tablet, TAKE THREE TABLETS BY MOUTH DAILY FOR 3 DAYS THEN TAKE TWO TABLETS BY MOUTH DAILY FOR 3 DAYS THEN TAKE ONE TABLET BY MOUTH DAILY FOR 3 DAYS, Disp: 18 tablet, Rfl: 0  ???  ketorolac (TORADOL) 10 MG tablet, TAKE ONE TABLET BY MOUTH EVERY 6 HOURS AS NEEDED FOR PAIN, Disp: 20 tablet, Rfl: 4  ???  FLUoxetine (PROZAC) 10 MG capsule, TAKE ONE CAPSULE BY MOUTH DAILY, Disp: 90 capsule, Rfl: 0  ???  meclizine (ANTIVERT) 25 MG tablet, Take 0.5-1 tablets by mouth 3 times daily as needed for Dizziness, Disp: 30 tablet, Rfl: 0  ???  ondansetron (ZOFRAN) 4 MG tablet, Take 1 tablet by mouth daily as needed for Nausea or Vomiting, Disp: 30 tablet, Rfl: 0  ???  busPIRone (BUSPAR) 5 MG tablet, Take 2 tablets by mouth 3 times daily as needed (anxiety), Disp: 60 tablet, Rfl: 5  ???  folic acid (FOLVITE) 1 MG tablet, Take 1 tablet by mouth daily Take 1 tab po daily., Disp: 90 tablet, Rfl: 1  ???  naproxen (NAPROSYN) 500 MG tablet, Take 1 tablet by mouth 2 times daily (with meals) Prn pain, Disp: 60 tablet, Rfl: 3  ???  hydrOXYzine (ATARAX) 25 MG tablet, Take 1 tablet by mouth 3 times daily as needed for Anxiety, Disp: 30 tablet, Rfl: 3  ???  ondansetron (ZOFRAN ODT) 4 MG disintegrating tablet, Take 1  tablet by mouth every 8 hours as needed for Nausea or Vomiting, Disp: 30 tablet, Rfl: 1  ???  RABEprazole (ACIPHEX) 20 MG tablet, Take 1 tablet by mouth daily, Disp: 30 tablet, Rfl: 5  ???  rizatriptan (MAXALT-MLT) 10 MG disintegrating tablet, Take 1 tablet by mouth once as needed for Migraine May repeat in 2 hours if needed, Disp: 9 tablet, Rfl: 5  ???  albuterol sulfate HFA (PROVENTIL HFA) 108 (90 Base) MCG/ACT inhaler, INHALE 2 PUFFS BY MOUTH INTO THE LUNGS EVERY 6 HOURS AS NEEDED FOR WHEEZING, Disp: 1 Inhaler, Rfl: 11  ???  cyclobenzaprine (FLEXERIL) 10 MG tablet, Take 1 tablet by mouth every 8 hours as needed (prn) Sedation precautions, Disp: 180 tablet, Rfl: 2  ???  meloxicam (MOBIC) 15 MG tablet, Take 1 tablet by mouth daily, Disp: 90 tablet, Rfl: 2  ???  rizatriptan (MAXALT) 10 MG tablet, Take 10 mg by mouth once as needed for Migraine May repeat in 2 hours if needed, Disp: , Rfl:   ???  metoclopramide (REGLAN) 10 MG tablet, Take 1 tablet by mouth 4 times daily as needed (nausea), Disp: 120 tablet, Rfl: 3  ???  Ranitidine HCl (ZANTAC 75 PO), Take 150 mg by mouth., Disp: , Rfl:   ???  albuterol (PROVENTIL) (5 MG/ML) 0.5% nebulizer solution, Take 1 mL by nebulization 4 times daily as needed for Wheezing., Disp: 120 each, Rfl: 0    Review of Systems   Constitutional: Positive for fatigue.   Respiratory: Negative.    Cardiovascular: Negative.    Musculoskeletal: Positive for arthralgias.   Skin: Negative for rash.       Physical Exam   Constitutional: Peggy Sanders is oriented to person, place, and time. Peggy Sanders. No distress.   Neck: Neck supple.   +wide circumference neck   Cardiovascular: Sanders rate, regular rhythm and Sanders heart sounds.   Pulmonary/Chest: Effort Sanders and breath sounds Sanders.   Musculoskeletal:   B hands with diffuse mild swelling and tenderness   Lymphadenopathy:     Peggy Sanders.   Neurological: Peggy Sanders is alert and oriented to person, place, and time.    Psychiatric: Peggy Sanders has a Sanders mood and affect. Peggy Sanders.   Nursing note and vitals reviewed.      Vitals:    04/11/18 1405   BP: 124/70   Resp: 16   Temp: 98.2 ??F (36.8 ??C)       Assessment    1. Undifferentiated connective tissue disease (HCC)    2. Hand arthritis    3. Screening for breast cancer    4. Tobacco dependence    5. Suspected sleep apnea        Plan    Peggy Sanders for edema.    Diagnoses and all orders for this visit:    Undifferentiated connective tissue disease (HCC)  Hand arthritis  -     External Referral To Rheumatology  -     predniSONE (DELTASONE) 20 MG tablet; TAKE THREE TABLETS BY MOUTH DAILY FOR 3 DAYS THEN TAKE TWO TABLETS BY MOUTH DAILY FOR 3 DAYS THEN TAKE ONE TABLET BY MOUTH DAILY FOR 3 DAYS. Discussed risks of frequent prednisone use, not recommended, referral to rheum placed.     Screening for breast cancer  -     [facility] DIGITAL SCREEN W OR WO CAD BILATERAL; Future    Tobacco dependence        -     Smoking cessation encouraged, risks discussed.    Suspected sleep apnea  -     Weight loss encouraged. [facility], [place], [facility], [place]

## 2018-04-11 NOTE — Telephone Encounter (Signed)
Reason for Disposition  ??? MODERATE hand swelling (e.g., visible swelling of hand and fingers; pitting edema)    Protocols used: HAND [facility]    [facility]      Pt calling c/o red, itchy, swollen hands.  Pt states symptoms started about 3:30 this morning.  Pt states she has a connective disorder and this happens about every other month.  She states Dr. Zerita BoersKoros wants to see her and will give her a steroid.  No other swelling, no sob.  Able to make a fist but feels tight.  Rates swelling as moderate.  Not painful.    Call soft transferred to [facility] to schedule appointment.

## 2018-04-17 NOTE — Telephone Encounter (Signed)
Noted  

## 2018-04-17 NOTE — Telephone Encounter (Signed)
Patient cancelled appointment on 04/19/18 with Dr. Benedict NeedyNath for New pt Sleep.    Reason: cancelled via reminder calls    Patient did not reschedule appointment.     Last OV N/A

## 2018-04-17 NOTE — Telephone Encounter (Signed)
Message routed to referring provider.

## 2018-04-19 ENCOUNTER — Encounter: Attending: Internal Medicine | Primary: Family Medicine

## 2018-05-04 ENCOUNTER — Ambulatory Visit
Admit: 2018-05-04 | Discharge: 2018-05-04 | Payer: PRIVATE HEALTH INSURANCE | Attending: Rheumatology | Primary: Family Medicine

## 2018-05-04 ENCOUNTER — Encounter

## 2018-05-04 DIAGNOSIS — M359 Systemic involvement of connective tissue, unspecified: Secondary | ICD-10-CM

## 2018-05-04 MED ORDER — HYDROXYCHLOROQUINE SULFATE 200 MG PO TABS
200 MG | ORAL_TABLET | Freq: Two times a day (BID) | ORAL | 3 refills | Status: AC
Start: 2018-05-04 — End: ?

## 2018-05-04 MED ORDER — PREDNISONE 10 MG PO TABS
10 MG | ORAL_TABLET | ORAL | 0 refills | Status: DC
Start: 2018-05-04 — End: 2018-08-09

## 2018-05-04 NOTE — Progress Notes (Signed)
[facility] Rheumatology                                                                                                                 Norm Salt, MD                                                           [address] [place], [place] [address]                                                             [phone] (F)      Dear Dr. Arsenio Katz, DO:  Please find Rheumatology assessment.Thank you for giving me the opportunity to be involved in [place] Peggy Sanders's care and I look forward following [place] along with you. If you have any questions or concerns please feel free to reach me.    Note is transcribed using voice recognition software. Inadvertent computerized transcription errors may be present.       Patient identification: Peggy Sanders,DOB: 02/18/1976,42 y.o.  Sex: female     Assessment / Plan:    [place] was seen today for follow-up.    Diagnoses and all orders for this visit:    Undifferentiated connective tissue disease (HCC)  -     C3 Complement; Future  -     C4 Complement; Future  -     Urinalysis; Future  -     ANA Reflex to Antibody Cascade; Future  -     Cyclic Citrul Peptide Antibody, IgG; Future  -     Rheumatoid Factor; Future    Inflammatory arthritis    Other orders  -     hydroxychloroquine (PLAQUENIL) 200 MG tablet; Take 1 tablet by mouth 2 times daily  -     predniSONE (DELTASONE) 10 MG tablet; 2 tab po daily x 7 days. 1.5 tab po daily x 7 days. 1 tab po daily x 7 days. 1/2 tab po daily 7 days and stop.         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  Has had several courses of prednisone since last seen me a year ago.  Started 40 mg of prednisone yesterday, continues to have arthralgias in her finger joints.  Could not tolerate oral  methotrexate -because of nausea vomiting.    Plan-  Recheck serologies today.  Recommend adding hydroxychloroquine as a steroid sparing agent for inflammatory polyarthritis.  Meantime, utilize low-dose prednisone taper until hydroxychloroquine gets to a therapeutic level.  If she  remains symptomatic, will plan to add leflunomide.  She is aware of protective measures for Raynaud's phenomenon, mild without any tissue loss.  Reassessment in 2 months.    I reviewed patient's history, referral documents and electronic medical records.  Copy of consult note is being routed electronically/faxed to referring physician.  #######################################################################      Subjective-  Follow up for inflammatory polyarthritis, fatigue and positive ANA.   She was seen by me last November, at that time I gave her methotrexate along with prednisone taper.  She had intractable vomiting with methotrexate, which she did not notify me and was lost to follow-up.  Since then, she had at least 6 courses of oral steroids for joint pain and pleuritic skin redness especially in her hands.  She had major flare day before yesterday, both hands were swollen, could not make a fist or use her hands, had left over prednisone, took 40 mg yesterday morning, and 20 mg this morning.  Joints are feeling bit better now, still has pain mainly at the volar aspect of MCPs.  She is extremely tired.  Continues to have pain in her neck, back, states that her whole body is achy and stiff.  Biphasic Raynaud's phenomenon without tissue loss in her fingers. she denies any rashes, photosensitivity, alopecia, pleurisy, GI or GU symptoms.  No skin thickening. All other ROS are negative.    Past Medical History:   Diagnosis Date   ??? Cervical intraepithelial neoplasia grade III with severe dysplasia 2008    cervical conization, follow up normal x 2 years--pap smear every 6 months   ??? GERD (gastroesophageal reflux disease)    ??? History of  endometriosis 2008    excised in 2008   ??? History of ovarian cyst 2008    multiple cystectomies   ??? History of trichomoniasis 2008   ??? PCOS (polycystic ovarian syndrome)     previously on metformin--stopped secondary to side effects     Past Surgical History:   Procedure Laterality Date   ??? ADENOIDECTOMY     ??? CERVIX SURGERY     ??? DILATION AND CURETTAGE  1993   ??? ENDOMETRIAL ABLATION  2008   ??? INNER EAR SURGERY      tubes inserted   ??? TUBAL LIGATION         FH- Mother- RA, Lichen planus, Grandmother- Scleroderma, Father- ILD.    Current Outpatient Medications   Medication Sig Dispense Refill   ??? hydroxychloroquine (PLAQUENIL) 200 MG tablet Take 1 tablet by mouth 2 times daily 60 tablet 3   ??? predniSONE (DELTASONE) 10 MG tablet 2 tab po daily x 7 days. 1.5 tab po daily x 7 days. 1 tab po daily x 7 days. 1/2 tab po daily 7 days and stop. 35 tablet 0   ??? ketorolac (TORADOL) 10 MG tablet TAKE ONE TABLET BY MOUTH EVERY 6 HOURS AS NEEDED FOR PAIN 20 tablet 4   ??? FLUoxetine (PROZAC) 10 MG capsule TAKE ONE CAPSULE BY MOUTH DAILY 90 capsule 0   ??? meclizine (ANTIVERT) 25 MG tablet Take 0.5-1 tablets by mouth 3 times daily as needed for Dizziness 30 tablet 0   ??? ondansetron (ZOFRAN) 4 MG tablet Take 1 tablet by mouth daily as needed for Nausea or Vomiting 30 tablet 0   ??? busPIRone (BUSPAR) 5 MG tablet Take 2 tablets by mouth 3 times daily as needed (anxiety) 60 tablet 5   ??? naproxen (NAPROSYN) 500 MG tablet Take 1 tablet by mouth  2 times daily (with meals) Prn pain 60 tablet 3   ??? hydrOXYzine (ATARAX) 25 MG tablet Take 1 tablet by mouth 3 times daily as needed for Anxiety 30 tablet 3   ??? ondansetron (ZOFRAN ODT) 4 MG disintegrating tablet Take 1 tablet by mouth every 8 hours as needed for Nausea or Vomiting 30 tablet 1   ??? RABEprazole (ACIPHEX) 20 MG tablet Take 1 tablet by mouth daily 30 tablet 5   ??? albuterol sulfate HFA (PROVENTIL HFA) 108 (90 Base) MCG/ACT inhaler INHALE 2 PUFFS BY MOUTH INTO THE LUNGS EVERY 6 HOURS AS  NEEDED FOR WHEEZING 1 Inhaler 11   ??? cyclobenzaprine (FLEXERIL) 10 MG tablet Take 1 tablet by mouth every 8 hours as needed (prn) Sedation precautions 180 tablet 2   ??? rizatriptan (MAXALT) 10 MG tablet Take 10 mg by mouth once as needed for Migraine May repeat in 2 hours if needed     ??? metoclopramide (REGLAN) 10 MG tablet Take 1 tablet by mouth 4 times daily as needed (nausea) 120 tablet 3   ??? Ranitidine HCl (ZANTAC 75 PO) Take 150 mg by mouth.     ??? albuterol (PROVENTIL) (5 MG/ML) 0.5% nebulizer solution Take 1 mL by nebulization 4 times daily as needed for Wheezing. 120 each 0   ??? rizatriptan (MAXALT-MLT) 10 MG disintegrating tablet Take 1 tablet by mouth once as needed for Migraine May repeat in 2 hours if needed 9 tablet 5     No current facility-administered medications for this visit.      Allergies   Allergen Reactions   ??? Esomeprazole Magnesium Swelling   ??? Lansoprazole Swelling   ??? Metformin And Related      Fatigue     ??? Protonix [Pantoprazole Sodium] Swelling   ??? Tylenol With Codeine #3 [Acetaminophen-Codeine]      Pt states she passed out when she took the medication.   ??? Methotrexate Derivatives Nausea And Vomiting       PHYSICAL EXAM:    Vitals:    BP 120/76    Ht 5' 4.02" (1.626 m)    Wt 265 lb (120.2 kg)    BMI 45.46 kg/m??   General appearance/ Psychiatric: well nourished, and well groomed, normal judgement, alert, appears stated age and cooperative.   MKS:   All MCPs and PIPs are tender bilaterally, volar aspect of MCPs are more tender and and dorsal.  Loose fist bilaterally.  Wrists are tender dorsally.  Subjective arthralgia in her shoulders, knees, ankle and feet joints. FROM in all other peripheral joints. Normal muscle strength in upper and lower Extremities.    Skin: normal skin texture, no thickening is noted. No rashes, digital ischemia.  She does not have any overt scleroderma skin changes.  I  could not appreciate dilated nailfold capillaries.  Normal radial pulses and dorsalis pedis  pulses, no distal ischemia or scar seen.    Chest - normal effort, CTA b/l.        DATA:   Lab Results   Component Value Date    WBC 12.4 (H) 02/24/2015    HGB 14.8 02/24/2015    HCT 42.5 02/24/2015    MCV 90.6 02/24/2015    PLT 292 02/24/2015         Chemistry        Component Value Date/Time    NA 141 05/13/2015 0905    K 4.3 05/13/2015 0905    CL 103 05/13/2015 0905    CO2 24 05/13/2015  0905    BUN 10 05/13/2015 0905    CREATININE 0.6 05/13/2015 0905        Component Value Date/Time    CALCIUM 9.3 05/13/2015 0905    ALKPHOS 68 05/13/2015 0905    AST 12 (L) 05/13/2015 0905    ALT 15 05/13/2015 0905    BILITOT 0.3 05/13/2015 0905          Lab Results   Component Value Date    ANA POSITIVE (A) 06/21/2017    SEDRATE 47 (H) 07/13/2017     No results found for: CKTOTAL  Lab Results   Component Value Date    TSH 2.17 03/25/2010         A/P- See above.

## 2018-05-05 ENCOUNTER — Encounter

## 2018-05-05 LAB — URINALYSIS
Bilirubin Urine: NEGATIVE
Blood, Urine: NEGATIVE
Glucose, Ur: NEGATIVE mg/dL
Ketones, Urine: NEGATIVE mg/dL
Leukocyte Esterase, Urine: NEGATIVE
Nitrite, Urine: NEGATIVE
Protein, UA: NEGATIVE mg/dL
Specific Gravity, UA: 1.028 (ref 1.005–1.030)
Urobilinogen, Urine: 0.2 E.U./dL (ref <2.0)
pH, UA: 6 (ref 5.0–8.0)

## 2018-05-05 LAB — ANA REFLEX TO ANTIBODY CASCADE: ANA: NEGATIVE

## 2018-05-05 LAB — C4 COMPLEMENT: C4 Complement: 20.5 mg/dL (ref 10.0–40.0)

## 2018-05-05 LAB — C3 COMPLEMENT: C3 Complement: 186.7 mg/dL — ABNORMAL HIGH (ref 90.0–180.0)

## 2018-05-05 LAB — RHEUMATOID FACTOR: Rheumatoid Factor: <10 IU/mL (ref <14)

## 2018-05-05 LAB — CYCLIC CITRUL PEPTIDE ANTIBODY, IGG: CC Peptide,IgG Ab: <0.5 U/mL (ref 0.0–2.9)

## 2018-05-08 MED ORDER — NAPROXEN 500 MG PO TABS
500 MG | ORAL_TABLET | Freq: Two times a day (BID) | ORAL | 3 refills | Status: DC
Start: 2018-05-08 — End: 2019-10-19

## 2018-05-08 MED ORDER — RABEPRAZOLE SODIUM 20 MG PO TBEC
20 MG | ORAL_TABLET | Freq: Every day | ORAL | 5 refills | Status: DC
Start: 2018-05-08 — End: 2018-10-09

## 2018-05-08 NOTE — Telephone Encounter (Signed)
.    Last office visit 04/11/18     Last written     Next office visit scheduled none    Requested Prescriptions     Pending Prescriptions Disp Refills   ??? RABEprazole (ACIPHEX) 20 MG tablet 30 tablet 5     Sig: Take 1 tablet by mouth daily   ??? naproxen (NAPROSYN) 500 MG tablet 60 tablet 3     Sig: Take 1 tablet by mouth 2 times daily (with meals) Prn pain

## 2018-05-08 NOTE — Telephone Encounter (Signed)
From: [name]  To: [name], MD  Sent: 05/05/2018 10:58 PM EDT  Subject: Test Results Question    I have a question about ANA REFLEX TO ANTIBODY CASCADE resulted on 05/05/18 at 10:42 AM.    Can you tell me what this means? My last ANA test was positive. Was this a different test that was ran before?

## 2018-06-13 ENCOUNTER — Encounter

## 2018-06-13 MED ORDER — NEOMYCIN-POLYMYXIN-HC 3.5-10000-1 OT SOLN
Freq: Four times a day (QID) | OTIC | 0 refills | Status: AC
Start: 2018-06-13 — End: 2018-06-20

## 2018-06-13 NOTE — Telephone Encounter (Signed)
On Call Communication:  Call from Peggy Sanders. 1 week hx of cold symptoms. Today left ear pain, swollen, unable to get q tip in it. Gland below tender. Cortosporin otic suspension ordered to the pharmacy. .follow up in the office if fails to improve or worsens.

## 2018-06-14 MED ORDER — FLUOXETINE HCL 10 MG PO CAPS
10 MG | ORAL_CAPSULE | ORAL | 1 refills | Status: DC
Start: 2018-06-14 — End: 2018-10-03

## 2018-06-14 NOTE — Telephone Encounter (Signed)
.    Last office visit 04-11-18    Last written 12-23-17 90 with 0      Next office visit scheduled Vno future ov     Requested Prescriptions     Pending Prescriptions Disp Refills   . FLUoxetine (PROZAC) 10 MG capsule [Pharmacy Med Name: FLUoxetine HCL 10 MG CAPSULE] 90 capsule 0     Sig: TAKE ONE CAPSULE BY MOUTH DAILY

## 2018-08-09 ENCOUNTER — Encounter

## 2018-08-09 ENCOUNTER — Ambulatory Visit
Admit: 2018-08-09 | Discharge: 2018-08-09 | Payer: PRIVATE HEALTH INSURANCE | Attending: Rheumatology | Primary: Family Medicine

## 2018-08-09 DIAGNOSIS — M359 Systemic involvement of connective tissue, unspecified: Secondary | ICD-10-CM

## 2018-08-09 MED ORDER — TIZANIDINE HCL 2 MG PO TABS
2 MG | ORAL_TABLET | ORAL | 1 refills | Status: DC
Start: 2018-08-09 — End: 2018-12-26

## 2018-08-09 MED ORDER — FOLIC ACID 1 MG PO TABS
1 MG | ORAL_TABLET | Freq: Every day | ORAL | 1 refills | Status: AC
Start: 2018-08-09 — End: 2019-08-09

## 2018-08-09 MED ORDER — INSULIN SYRINGE 30G X 5/16" 1 ML MISC
1 refills | Status: AC
Start: 2018-08-09 — End: ?

## 2018-08-09 MED ORDER — METHOTREXATE SODIUM 50 MG/2ML IJ SOLN
50 MG/2ML | INTRAMUSCULAR | 1 refills | Status: DC
Start: 2018-08-09 — End: 2018-10-18

## 2018-08-09 NOTE — Progress Notes (Signed)
[facility] Rheumatology                                                                                                                 Norm Salt, MD                                                           [address] [place], [place] [address]                                                             [phone] (F)      Dear Dr. Arsenio Katz, DO:  Please find Rheumatology assessment.Thank you for giving me the opportunity to be involved in [place] Peggy Sanders's care and I look forward following [place] along with you. If you have any questions or concerns please feel free to reach me.    Note is transcribed using voice recognition software. Inadvertent computerized transcription errors may be present.       Patient identification: Peggy Sanders,DOB: 10-Apr-1976,42 y.o.  Sex: female     Assessment / Plan:    [place] was seen today for follow-up.    Diagnoses and all orders for this visit:    Undifferentiated connective tissue disease (HCC)    Inflammatory arthritis  -     Creatinine, Serum; Future  -     Hepatic Function Panel; Future  -     Sedimentation Rate; Future  -     C-Reactive Protein; Future  -     CBC Auto Differential; Future    High risk medication use  -     AST(SGOT) & ALT(SGPT); Standing  -     CBC Auto Differential; Standing  -     C-Reactive Protein; Standing  -     Creatinine, Serum; Standing  -     Sedimentation Rate; Standing    Myofascial pain    Other orders  -     tiZANidine (ZANAFLEX) 2 MG tablet; Take 1-2 tab time.  -     methotrexate Sodium (RHEUMATREX) 50 MG/2ML chemo injection; Inject 0.6 ml sc once a week.  -     folic acid (FOLVITE) 1 MG tablet; Take 1 tablet by mouth daily Take 1 tab po daily.  -     Insulin Syringe-Needle U-100 (INSULIN SYRINGE 1CC/30GX5/16") 30G X 5/16" 1 ML MISC; Dispense 1 Box of 10. Use 1 syringe with needle once a week to inject sc  methotrexate.         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     Inflammatory arthritis improved on Plaquenil, still has active disease, unable to make full fist.  Myofascial pain-generalized achiness in her neck, back, leg muscles.  Raynaud's phenomenon stable.  No tissue loss.    Plan-  Check labs today as above.  Continue Plaquenil 200 mg twice daily.  Add methotrexate subcu 0.6 mL weekly.  She could not tolerate oral methotrexate because of GI side effects.  Side effects, directions, monitoring reiterated, would be due for blood work in a month.  Tizanidine for myofascial pain.  Call me with any flares, reassessment in couple of months.    I reviewed patient's history, referral documents and electronic medical records.  Copy of consult note is being routed electronically/faxed to referring physician.  #######################################################################      Subjective-  Follow up for inflammatory polyarthritis, fatigue and positive ANA.   Interval Nino Glow has been noticing improvement on hydroxychloroquine, states that she has not taken prednisone after starting Plaquenil.  It has helped with stiffness, pain, swelling, however still has quite a bit of symptoms mainly in her finger joints, wrists, neck, back, shoulders, knees, ankle and feet joints, worse in the morning with stiffness lasting for about 30 minutes to an hour, bad days he is not able to make full fist.  She is tolerating medications well.  Biphasic Raynaud's phenomena is persistent without any tissue loss. she denies any rashes, photosensitivity, alopecia, pleurisy, GI or GU symptoms.  No skin thickening. All other ROS are negative.    Past Medical History:   Diagnosis Date   ??? Cervical intraepithelial neoplasia grade III with severe dysplasia 2008    cervical conization, follow up normal x 2  years--pap smear every 6 months   ??? GERD (gastroesophageal reflux disease)    ??? History of endometriosis 2008    excised in 2008   ??? History of ovarian cyst 2008    multiple cystectomies   ??? History of trichomoniasis 2008   ??? PCOS (polycystic ovarian syndrome)     previously on metformin--stopped secondary to side effects     Past Surgical History:   Procedure Laterality Date   ??? ADENOIDECTOMY     ??? CERVIX SURGERY     ??? DILATION AND CURETTAGE  1993   ??? ENDOMETRIAL ABLATION  2008   ??? INNER EAR SURGERY      tubes inserted   ??? TUBAL LIGATION         FH- Mother- RA, Lichen planus, Grandmother- Scleroderma, Father- ILD.    Current Outpatient Medications   Medication Sig Dispense Refill   ??? tiZANidine (ZANAFLEX) 2 MG tablet Take 1-2 tab time. 90 tablet 1   ??? methotrexate Sodium (RHEUMATREX) 50 MG/2ML chemo injection Inject 0.6 ml sc once a week. 2 vial 1   ??? folic acid (FOLVITE) 1 MG tablet Take 1 tablet by mouth daily Take 1 tab po daily. 90 tablet 1   ??? Insulin Syringe-Needle U-100 (INSULIN SYRINGE 1CC/30GX5/16") 30G X 5/16" 1 ML MISC Dispense 1 Box of 10. Use 1 syringe with needle once a week to inject sc methotrexate. 1 each 1   ??? FLUoxetine (PROZAC) 10 MG capsule TAKE ONE CAPSULE BY MOUTH DAILY 90 capsule 1   ??? RABEprazole (ACIPHEX) 20 MG tablet Take 1 tablet by mouth daily 30 tablet 5   ??? naproxen (NAPROSYN) 500 MG tablet Take 1 tablet by mouth 2 times daily (with meals) Prn pain 60 tablet 3   ??? hydroxychloroquine (  PLAQUENIL) 200 MG tablet Take 1 tablet by mouth 2 times daily 60 tablet 3   ??? ketorolac (TORADOL) 10 MG tablet TAKE ONE TABLET BY MOUTH EVERY 6 HOURS AS NEEDED FOR PAIN 20 tablet 4   ??? meclizine (ANTIVERT) 25 MG tablet Take 0.5-1 tablets by mouth 3 times daily as needed for Dizziness 30 tablet 0   ??? ondansetron (ZOFRAN) 4 MG tablet Take 1 tablet by mouth daily as needed for Nausea or Vomiting 30 tablet 0   ??? busPIRone (BUSPAR) 5 MG tablet Take 2 tablets by mouth 3 times daily as needed (anxiety) 60 tablet  5   ??? hydrOXYzine (ATARAX) 25 MG tablet Take 1 tablet by mouth 3 times daily as needed for Anxiety 30 tablet 3   ??? ondansetron (ZOFRAN ODT) 4 MG disintegrating tablet Take 1 tablet by mouth every 8 hours as needed for Nausea or Vomiting 30 tablet 1   ??? albuterol sulfate HFA (PROVENTIL HFA) 108 (90 Base) MCG/ACT inhaler INHALE 2 PUFFS BY MOUTH INTO THE LUNGS EVERY 6 HOURS AS NEEDED FOR WHEEZING 1 Inhaler 11   ??? cyclobenzaprine (FLEXERIL) 10 MG tablet Take 1 tablet by mouth every 8 hours as needed (prn) Sedation precautions 180 tablet 2   ??? rizatriptan (MAXALT) 10 MG tablet Take 10 mg by mouth once as needed for Migraine May repeat in 2 hours if needed     ??? metoclopramide (REGLAN) 10 MG tablet Take 1 tablet by mouth 4 times daily as needed (nausea) 120 tablet 3   ??? Ranitidine HCl (ZANTAC 75 PO) Take 150 mg by mouth.     ??? albuterol (PROVENTIL) (5 MG/ML) 0.5% nebulizer solution Take 1 mL by nebulization 4 times daily as needed for Wheezing. 120 each 0   ??? rizatriptan (MAXALT-MLT) 10 MG disintegrating tablet Take 1 tablet by mouth once as needed for Migraine May repeat in 2 hours if needed 9 tablet 5     No current facility-administered medications for this visit.      Allergies   Allergen Reactions   ??? Esomeprazole Magnesium Swelling   ??? Lansoprazole Swelling   ??? Metformin And Related      Fatigue     ??? Protonix [Pantoprazole Sodium] Swelling   ??? Tylenol With Codeine #3 [Acetaminophen-Codeine]      Pt states she passed out when she took the medication.   ??? Methotrexate Derivatives Nausea And Vomiting       PHYSICAL EXAM:    Vitals:    BP 120/68    Ht 5' 4.02" (1.626 m)    Wt 277 lb (125.6 kg)    BMI 47.52 kg/m??   General appearance/ Psychiatric: well nourished, and well groomed, normal judgement, alert, appears stated age and cooperative.   MKS:   Loose fist bilaterally, tenderness across MCPs, PIPs both hands, right worse than left.  Subjective arthralgias in all her finger joints, wrists, shoulders, ankle and feet  joints.  I do not appreciate any overtly inflamed joints in upper or lower extremity's.   FROM in all other peripheral joints. Normal muscle strength in upper and lower Extremities.    Skin: normal skin texture, no thickening is noted. No rashes, digital ischemia.  She does not have any overt scleroderma skin changes.  I  could not appreciate dilated nailfold capillaries.  Normal radial pulses and dorsalis pedis pulses, no distal ischemia or scar seen.         DATA:   Lab Results   Component Value Date  WBC 12.4 (H) 02/24/2015    HGB 14.8 02/24/2015    HCT 42.5 02/24/2015    MCV 90.6 02/24/2015    PLT 292 02/24/2015         Chemistry        Component Value Date/Time    NA 141 05/13/2015 0905    K 4.3 05/13/2015 0905    CL 103 05/13/2015 0905    CO2 24 05/13/2015 0905    BUN 10 05/13/2015 0905    CREATININE 0.6 05/13/2015 0905        Component Value Date/Time    CALCIUM 9.3 05/13/2015 0905    ALKPHOS 68 05/13/2015 0905    AST 12 (L) 05/13/2015 0905    ALT 15 05/13/2015 0905    BILITOT 0.3 05/13/2015 0905          Lab Results   Component Value Date    ANA Negative 05/04/2018    SEDRATE 47 (H) 07/13/2017     No results found for: CKTOTAL  Lab Results   Component Value Date    TSH 2.17 03/25/2010         A/P- See above.

## 2018-08-10 LAB — HEPATIC FUNCTION PANEL
ALT: 12 U/L (ref 10–40)
AST: 11 U/L — ABNORMAL LOW (ref 15–37)
Albumin: 3.9 g/dL (ref 3.4–5.0)
Alkaline Phosphatase: 74 U/L (ref 40–129)
Bilirubin, Direct: <0.2 mg/dL (ref 0.0–0.3)
Total Bilirubin: 0.3 mg/dL (ref 0.0–1.0)
Total Protein: 6.8 g/dL (ref 6.4–8.2)

## 2018-08-10 LAB — CBC WITH AUTO DIFFERENTIAL
Basophils %: 0.5 %
Basophils Absolute: 0 10*3/uL (ref 0.0–0.2)
Eosinophils %: 0.9 %
Eosinophils Absolute: 0.1 10*3/uL (ref 0.0–0.6)
Hematocrit: 41 % (ref 36.0–48.0)
Hemoglobin: 14.1 g/dL (ref 12.0–16.0)
Lymphocytes %: 23.1 %
Lymphocytes Absolute: 2.3 10*3/uL (ref 1.0–5.1)
MCH: 30.6 pg (ref 26.0–34.0)
MCHC: 34.4 g/dL (ref 31.0–36.0)
MCV: 89 fL (ref 80.0–100.0)
MPV: 7 fL (ref 5.0–10.5)
Monocytes %: 5.9 %
Monocytes Absolute: 0.6 10*3/uL (ref 0.0–1.3)
Neutrophils %: 69.6 %
Neutrophils Absolute: 7.1 10*3/uL (ref 1.7–7.7)
Platelets: 319 10*3/uL (ref 135–450)
RBC: 4.6 M/uL (ref 4.00–5.20)
RDW: 12.5 % (ref 12.4–15.4)
WBC: 10.1 10*3/uL (ref 4.0–11.0)

## 2018-08-10 LAB — C-REACTIVE PROTEIN: CRP: 9.5 mg/L — ABNORMAL HIGH (ref 0.0–5.1)

## 2018-08-10 LAB — SEDIMENTATION RATE: Sed Rate, Automated: 48 mm/h — ABNORMAL HIGH (ref 0–20)

## 2018-08-10 LAB — CREATININE
Creatinine: 0.6 mg/dL (ref 0.6–1.1)
GFR African American: >60 (ref >60)
GFR Non-African American: >60 (ref >60)

## 2018-08-28 MED ORDER — METHYLPREDNISOLONE 4 MG PO TBPK
4 MG | PACK | ORAL | 0 refills | Status: AC
Start: 2018-08-28 — End: 2018-09-03

## 2018-08-28 NOTE — Telephone Encounter (Signed)
Medrol dose pack e-scribed per Dr Dionisio DavidAdhikari's instructions.

## 2018-08-28 NOTE — Telephone Encounter (Signed)
I've [place] the [place] pharmacy [place] steroid.

## 2018-08-28 NOTE — Telephone Encounter (Signed)
From: Ara KussmaulKimberley A Foote  To: Karle Plumberara Adhikari, MD  Sent: 08/28/2018 9:02 AM EST  Subject: Prescription Question    Dr. Verlan FriendsAdhikari,    [place] in [place]. My feet and hands are swollen really bad and itching. Haven't had the problem for months. If I find a pharmacy down here is there any way you can call me in some steroids? I'm pretty miserable.    Thanks  Vassie MoselleKim Tussey  [phone]

## 2018-09-26 NOTE — Telephone Encounter (Signed)
From: Ara Kussmaul  To: Karle Plumber, MD  Sent: 09/25/2018 5:59 PM EST  Subject: Prescription Question    Dr. Renford Dills,    I have the flu. Should I still take my injection?    Peggy Sanders   [phone]

## 2018-09-27 ENCOUNTER — Inpatient Hospital Stay: Payer: PRIVATE HEALTH INSURANCE | Primary: Family Medicine

## 2018-09-27 ENCOUNTER — Inpatient Hospital Stay: Admit: 2018-09-27 | Payer: PRIVATE HEALTH INSURANCE | Primary: Family Medicine

## 2018-09-27 ENCOUNTER — Ambulatory Visit
Admit: 2018-09-27 | Discharge: 2018-09-27 | Payer: PRIVATE HEALTH INSURANCE | Attending: Medical | Primary: Family Medicine

## 2018-09-27 DIAGNOSIS — R05 Cough: Secondary | ICD-10-CM

## 2018-09-27 DIAGNOSIS — R059 Cough, unspecified: Secondary | ICD-10-CM

## 2018-09-27 LAB — POCT INFLUENZA A/B
Influenza A Ab: NEGATIVE
Influenza B Ab: NEGATIVE

## 2018-09-27 MED ORDER — AZITHROMYCIN 250 MG PO TABS
250 MG | PACK | ORAL | 0 refills | Status: AC
Start: 2018-09-27 — End: 2018-10-07

## 2018-09-27 MED ORDER — PROMETHAZINE-DM 6.25-15 MG/5ML PO SYRP
Freq: Four times a day (QID) | ORAL | 0 refills | Status: AC | PRN
Start: 2018-09-27 — End: 2018-10-04

## 2018-09-27 NOTE — Progress Notes (Signed)
10/03/2018  Peggy Sanders (DOB: May 23, 1976)  43 y.o.      HPI    Patient presents for evaluation of flu like symptoms. Patient reports that symptoms started 10 days ago with fever, body aches, and cough. Has also had nausea. No vomiting. Decreased appetite, tolerating liquids. Had fevers had been as high as 102. Came down with tylenol/ibuprofen. Symptoms have persisted. Fever last night was 101.7. Also still with cough. Cough is nonproductive, feels chest congestion. Still unable to tolerate food. No energy. 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      Review of Systems   Constitutional: Positive for fatigue and fever. Negative for activity change and chills.   HENT: Positive for congestion and sore throat. Negative for ear pain and rhinorrhea.    Eyes: Negative for visual disturbance.   Respiratory: Positive for cough. Negative for shortness of breath.    Cardiovascular: Negative for chest pain and palpitations.   Gastrointestinal: Negative for abdominal pain, constipation, diarrhea, nausea and vomiting.   Genitourinary: Negative for difficulty urinating and dysuria.   Musculoskeletal: Negative for arthralgias and myalgias.   Skin: Negative for rash.   Neurological: Negative for dizziness, weakness and numbness.   Psychiatric/Behavioral: Negative for sleep disturbance.       Allergies, past medical history, family history, and social history reviewed and unchanged from previous encounter.     Current Outpatient Medications   Medication Sig Dispense Refill   ??? promethazine-dextromethorphan (PROMETHAZINE-DM) 6.25-15 MG/5ML syrup Take 5 mLs by mouth 4 times daily as needed for Cough 118 mL 0   ??? azithromycin (ZITHROMAX Z-PAK) 250 MG tablet Take 2 tablets on day 1, then 1 tablet daily for the next 4 days. 1 packet 0   ??? tiZANidine (ZANAFLEX) 2 MG tablet Take 1-2 tab time. 90 tablet 1   ??? methotrexate Sodium (RHEUMATREX) 50 MG/2ML chemo injection Inject 0.6 ml sc once a week. 2  vial 1   ??? folic acid (FOLVITE) 1 MG tablet Take 1 tablet by mouth daily Take 1 tab po daily. 90 tablet 1   ??? Insulin Syringe-Needle U-100 (INSULIN SYRINGE 1CC/30GX5/16") 30G X 5/16" 1 ML MISC Dispense 1 Box of 10. Use 1 syringe with needle once a week to inject sc methotrexate. 1 each 1   ??? FLUoxetine (PROZAC) 10 MG capsule TAKE ONE CAPSULE BY MOUTH DAILY 90 capsule 1   ??? RABEprazole (ACIPHEX) 20 MG tablet Take 1 tablet by mouth daily 30 tablet 5   ??? naproxen (NAPROSYN) 500 MG tablet Take 1 tablet by mouth 2 times daily (with meals) Prn pain 60 tablet 3   ??? hydroxychloroquine (PLAQUENIL) 200 MG tablet Take 1 tablet by mouth 2 times daily 60 tablet 3   ??? ketorolac (TORADOL) 10 MG tablet TAKE ONE TABLET BY MOUTH EVERY 6 HOURS AS NEEDED FOR PAIN 20 tablet 4   ??? meclizine (ANTIVERT) 25 MG tablet Take 0.5-1 tablets by mouth 3 times daily as needed for Dizziness 30 tablet 0   ??? ondansetron (ZOFRAN) 4 MG tablet Take 1 tablet by mouth daily as needed for Nausea or Vomiting 30 tablet 0   ??? busPIRone (BUSPAR) 5 MG tablet Take 2 tablets by mouth 3 times daily as needed (anxiety) 60 tablet 5   ??? hydrOXYzine (ATARAX) 25 MG tablet Take 1 tablet by mouth 3 times daily as needed for Anxiety 30 tablet 3   ??? ondansetron (ZOFRAN ODT) 4 MG disintegrating tablet Take 1 tablet by mouth every 8 hours as  needed for Nausea or Vomiting 30 tablet 1   ??? albuterol sulfate HFA (PROVENTIL HFA) 108 (90 Base) MCG/ACT inhaler INHALE 2 PUFFS BY MOUTH INTO THE LUNGS EVERY 6 HOURS AS NEEDED FOR WHEEZING 1 Inhaler 11   ??? cyclobenzaprine (FLEXERIL) 10 MG tablet Take 1 tablet by mouth every 8 hours as needed (prn) Sedation precautions 180 tablet 2   ??? rizatriptan (MAXALT) 10 MG tablet Take 10 mg by mouth once as needed for Migraine May repeat in 2 hours if needed     ??? metoclopramide (REGLAN) 10 MG tablet Take 1 tablet by mouth 4 times daily as needed (nausea) 120 tablet 3   ??? Ranitidine HCl (ZANTAC 75 PO) Take 150 mg by mouth.     ??? albuterol  (PROVENTIL) (5 MG/ML) 0.5% nebulizer solution Take 1 mL by nebulization 4 times daily as needed for Wheezing. 120 each 0   ??? rizatriptan (MAXALT-MLT) 10 MG disintegrating tablet Take 1 tablet by mouth once as needed for Migraine May repeat in 2 hours if needed 9 tablet 5     No current facility-administered medications for this visit.        Vitals:    09/27/18 1548   BP: 118/70   Site: Left Upper Arm   Position: Sitting   Cuff Size: Large Adult   Pulse: 101   Resp: 24   Temp: 97.9 ??F (36.6 ??C)   TempSrc: Oral   SpO2: 92%   Weight: 271 lb 3.2 oz (123 kg)   Height: 5\' 4"  (1.626 m)     Estimated body mass index is 46.55 kg/m?? as calculated from the following:    Height as of this encounter: 5\' 4"  (1.626 m).    Weight as of this encounter: 271 lb 3.2 oz (123 kg).    Physical Exam  Constitutional:       General: She is not in acute distress.     Appearance: She is well-developed.   HENT:      Head: Normocephalic and atraumatic.      Left Ear: A middle ear effusion is present.      Nose: Rhinorrhea present.      Mouth/Throat:      Pharynx: Posterior oropharyngeal erythema present.   Eyes:      Conjunctiva/sclera: Conjunctivae normal.      Pupils: Pupils are equal, round, and reactive to light.   Neck:      Musculoskeletal: Neck supple.   Cardiovascular:      Rate and Rhythm: Normal rate and regular rhythm.      Heart sounds: Normal heart sounds. No murmur.   Pulmonary:      Effort: Pulmonary effort is normal.      Breath sounds: Normal breath sounds. No wheezing.   Abdominal:      General: Bowel sounds are normal.      Palpations: Abdomen is soft.      Tenderness: There is no tenderness.   Lymphadenopathy:      Cervical: No cervical adenopathy.   Skin:     General: Skin is warm and dry.      Findings: No rash.   Neurological:      Mental Status: She is alert and oriented to person, place, and time.      Deep Tendon Reflexes: Reflexes are normal and symmetric.         ASSESSMENT and PLAN:  Peggy Sanders was seen today for  fatigue, cough, shortness of breath, fever, nausea and generalized body aches.  Diagnoses and all orders for this visit:    Coughing  -     POCT Influenza A/B- neg  -     XR CHEST STANDARD (2 VW); Future    Bronchitis  -     promethazine-dextromethorphan (PROMETHAZINE-DM) 6.25-15 MG/5ML syrup; Take 5 mLs by mouth 4 times daily as needed for Cough  -     azithromycin (ZITHROMAX Z-PAK) 250 MG tablet; Take 2 tablets on day 1, then 1 tablet daily for the next 4 days.  -  Recommend nasal saline prn, mucinex DM, and fluids.  Patient to call office if no improvement in 1 week       Return if symptoms worsen or fail to improve.

## 2018-10-04 ENCOUNTER — Ambulatory Visit
Admit: 2018-10-04 | Discharge: 2018-10-04 | Payer: PRIVATE HEALTH INSURANCE | Attending: Rheumatology | Primary: Family Medicine

## 2018-10-04 DIAGNOSIS — M0600 Rheumatoid arthritis without rheumatoid factor, unspecified site: Secondary | ICD-10-CM

## 2018-10-04 MED ORDER — PREDNISONE 10 MG PO TABS
10 MG | ORAL_TABLET | ORAL | 0 refills | Status: DC
Start: 2018-10-04 — End: 2018-11-15

## 2018-10-04 MED ORDER — ETANERCEPT 50 MG/ML SC SOCT
50 | CARTRIDGE | SUBCUTANEOUS | 0 refills | Status: DC
Start: 2018-10-04 — End: 2018-10-05

## 2018-10-04 NOTE — Telephone Encounter (Signed)
Refill Request     Last Seen: 09/27/18    Last Written: 06/14/18 with 1 refill    Next Appointment:   Future Appointments   Date Time Provider Department Center   10/04/2018  2:15 PM Karle Plumber, MD [facility]       Requested Prescriptions     Pending Prescriptions Disp Refills   . FLUoxetine (PROZAC) 10 MG capsule 90 capsule 1

## 2018-10-04 NOTE — Progress Notes (Signed)
[facility]                                                                                                                 Norm Salt, MD                                                           [address] [place], [place] [address]                                                             [phone] (F)      Dear Dr. Arsenio Katz, DO:  Please find Rheumatology assessment.Thank you for giving me the opportunity to be involved in [place] Silf[place]'s care and I look forward following [place] along with you. If you have any questions or concerns please feel free to reach me.    Note is transcribed using voice recognition software. Inadvertent computerized transcription errors may be present.       Patient identification: Peggy Sanders,DOB: April 18, 1976,43 y.o.  Sex: female     Assessment / Plan:    [place] was seen today for follow-up.    Diagnoses and all orders for this visit:    Seronegative rheumatoid arthritis (HCC)    High risk medication use  -     CBC Auto Differential; Future  -     Sedimentation Rate; Future  -     C-Reactive Protein; Future  -     Comprehensive Metabolic Panel; Future  -     Quantiferon, Incubated; Future  -     Lupus Anticoagulant; Future    Serologic abnormality    Raynaud's disease without gangrene    Other orders  -     predniSONE (DELTASONE) 10 MG tablet; 2 tab po daily x 7 days. 1.5 tab po daily x 7 days. 1 tab po daily x 7 days. 1/2 tab po daily 7 days and stop.  -     Etanercept (ENBREL MINI) 50 MG/ML SOCT; Inject 50 mg sc weekly.         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  Symptoms are worse off the methotrexate.    Noninflammatory musculoskeletal discomfort from fibromyalgia.  Worse symptoms with flu  and being off the methotrexate.    Fluctuating titers of ANA with biphasic  Raynaud's phenomenon.  No tissue loss, infrequent rayanuds.    Positive lupus anticoagulant without any event.  Repeat lupus anticoagulant, if positive, will plan for baby aspirin.  Anticardiolipin antibodies and beta-2 glycoprotein is negative.  Plan-  Continue hydroxychloroquine 400 mg a day, increase methotrexate 0.8 mL subcu weekly, add prednisone taper.  Discussed about starting biologic therapy, various options were discussed, prefer either Orencia or Enbrel given fluctuating titers of positive ANA.  Will PA Enbrel.  Side effects, directions, monitoring were discussed including but not limited to typical, atypical infections, lymphoproliferative disorders, injection site reactions, reading information was also provided.  She was advised to call us in 2 weeks to follow-up on PA.  Lab today and also in 4 weeks.  Follow-up in 8 weeks.  Time spent with patient is 40 minutes, > 20 minutes was spent explaining disease process, management plan as above.    I reviewed patient's history, referral documents and electronic medical records.  Copy of consult note is being routed electronically/faxed to referring physician.  #######################################################################      Subjective-  Follow up for inflammatory polyarthritis, fatigue and positive ANA.     Interval changes-  She started methotrexate approximately 3 months ago, did notice some improvement in her joint symptoms, has been off of methotrexate for last 3 weeks, because of possible viral infection causing high-grade fever.  She has been afebrile for last 7 days.  However, she hurts all over, finger joints are swollen, is barely able to make fist, fatigue extreme.  She works 60 hours a week, in fact put her resignation today for 2 weeks notice.  I am worried that she may not have insurance and might not be able to afford medications.  Prednisone helps well with her symptoms.  Raynaud's phenomena is stable without any tissue loss.   No rashes, photosensitivity, alopecia, pleurisy, GI/GU symptoms or skin thickening.  All other ROS are negative.    Past Medical History:   Diagnosis Date   ??? Cervical intraepithelial neoplasia grade III with severe dysplasia 2008    cervical conization, follow up normal x 2 years--pap smear every 6 months   ??? GERD (gastroesophageal reflux disease)    ??? History of endometriosis 2008    excised in 2008   ??? History of ovarian cyst 2008    multiple cystectomies   ??? History of trichomoniasis 2008   ??? PCOS (polycystic ovarian syndrome)     previously on metformin--stopped secondary to side effects     Past Surgical History:   Procedure Laterality Date   ??? ADENOIDECTOMY     ??? CERVIX SURGERY     ??? DILATION AND CURETTAGE  1993   ??? ENDOMETRIAL ABLATION  2008   ??? INNER EAR SURGERY      tubes inserted   ??? TUBAL LIGATION         FH- Mother- RA, Lichen planus, Grandmother- Scleroderma, Father- ILD.    Current Outpatient Medications   Medication Sig Dispense Refill   ??? predniSONE (DELTASONE) 10 MG tablet 2 tab po daily x 7 days. 1.5 tab po daily x 7 days. 1 tab po daily x 7 days. 1/2 tab po daily 7 days and stop. 35 tablet 0   ??? Etanercept (ENBREL MINI) 50 MG/ML SOCT Inject 50 mg sc weekly. 4 Cartridge 0   ??? promethazine-dextromethorphan (PROMETHAZINE-DM) 6.25-15 MG/5ML syrup Take 5 mLs by mouth 4 times daily as needed for Cough 118 mL 0   ??? azithromycin (ZITHROMAX Z-PAK) 250 MG tablet Take 2 tablets on day 1, then 1 tablet daily for the next 4 days. 1 packet 0   ??? tiZANidine (ZANAFLEX) 2 MG tablet Take 1-2 tab time. 90 tablet 1   ???  methotrexate Sodium (RHEUMATREX) 50 MG/2ML chemo injection Inject 0.6 ml sc once a week. 2 vial 1   ??? folic acid (FOLVITE) 1 MG tablet Take 1 tablet by mouth daily Take 1 tab po daily. 90 tablet 1   ??? Insulin Syringe-Needle U-100 (INSULIN SYRINGE 1CC/30GX5/16") 30G X 5/16" 1 ML MISC Dispense 1 Box of 10. Use 1 syringe with needle once a week to inject sc methotrexate. 1 each 1   ??? FLUoxetine (PROZAC)  10 MG capsule TAKE ONE CAPSULE BY MOUTH DAILY 90 capsule 1   ??? RABEprazole (ACIPHEX) 20 MG tablet Take 1 tablet by mouth daily 30 tablet 5   ??? naproxen (NAPROSYN) 500 MG tablet Take 1 tablet by mouth 2 times daily (with meals) Prn pain 60 tablet 3   ??? hydroxychloroquine (PLAQUENIL) 200 MG tablet Take 1 tablet by mouth 2 times daily 60 tablet 3   ??? ketorolac (TORADOL) 10 MG tablet TAKE ONE TABLET BY MOUTH EVERY 6 HOURS AS NEEDED FOR PAIN 20 tablet 4   ??? meclizine (ANTIVERT) 25 MG tablet Take 0.5-1 tablets by mouth 3 times daily as needed for Dizziness 30 tablet 0   ??? ondansetron (ZOFRAN) 4 MG tablet Take 1 tablet by mouth daily as needed for Nausea or Vomiting 30 tablet 0   ??? busPIRone (BUSPAR) 5 MG tablet Take 2 tablets by mouth 3 times daily as needed (anxiety) 60 tablet 5   ??? hydrOXYzine (ATARAX) 25 MG tablet Take 1 tablet by mouth 3 times daily as needed for Anxiety 30 tablet 3   ??? ondansetron (ZOFRAN ODT) 4 MG disintegrating tablet Take 1 tablet by mouth every 8 hours as needed for Nausea or Vomiting 30 tablet 1   ??? rizatriptan (MAXALT-MLT) 10 MG disintegrating tablet Take 1 tablet by mouth once as needed for Migraine May repeat in 2 hours if needed 9 tablet 5   ??? albuterol sulfate HFA (PROVENTIL HFA) 108 (90 Base) MCG/ACT inhaler INHALE 2 PUFFS BY MOUTH INTO THE LUNGS EVERY 6 HOURS AS NEEDED FOR WHEEZING 1 Inhaler 11   ??? cyclobenzaprine (FLEXERIL) 10 MG tablet Take 1 tablet by mouth every 8 hours as needed (prn) Sedation precautions 180 tablet 2   ??? rizatriptan (MAXALT) 10 MG tablet Take 10 mg by mouth once as needed for Migraine May repeat in 2 hours if needed     ??? metoclopramide (REGLAN) 10 MG tablet Take 1 tablet by mouth 4 times daily as needed (nausea) 120 tablet 3   ??? Ranitidine HCl (ZANTAC 75 PO) Take 150 mg by mouth.     ??? albuterol (PROVENTIL) (5 MG/ML) 0.5% nebulizer solution Take 1 mL by nebulization 4 times daily as needed for Wheezing. 120 each 0     No current facility-administered medications  for this visit.      Allergies   Allergen Reactions   ??? Esomeprazole Magnesium Swelling   ??? Lansoprazole Swelling   ??? Metformin And Related      Fatigue     ??? Protonix [Pantoprazole Sodium] Swelling   ??? Tylenol With Codeine #3 [Acetaminophen-Codeine]      Pt states she passed out when she took the medication.   ??? Methotrexate Derivatives Nausea And Vomiting       PHYSICAL EXAM:    Vitals:    BP 120/77    Ht 5\' 4"  (1.626 m)    Wt 274 lb (124.3 kg)    BMI 47.03 kg/m??   General appearance/ Psychiatric: well nourished, and well groomed,  normal judgement, alert, appears stated age and cooperative.   MKS:     28 joint JOINT COUNT:                               Right                                                  Left   Swell Tender Swell Tender   PIP1           PIP2    x    x   PIP3    x    x   PIP4   x    x   PIP5          MCP1           MCP2  xx  x    x   MCP3  xx  x    x   MCP4  x  x    x   MCP5  x  x    x   Wrist           Elbow           Shoulder          Knee             Loose fist R hand.   Subjective arthralgias in her wrists, shoulders, knees, ankle and feet joints-mainly MTPs.  Generalized hyperesthesia.  All her fibromyalgia tender points are positive.  Full range of motion in all peripheral joints.  Normal gait and muscle strength in upper and lower extremities.  Skin: normal skin texture, no thickening is noted. No rashes, digital ischemia.  She does not have any overt scleroderma skin changes.          DATA:   Lab Results   Component Value Date    WBC 10.1 08/09/2018    HGB 14.1 08/09/2018    HCT 41.0 08/09/2018    MCV 89.0 08/09/2018    PLT 319 08/09/2018         Chemistry        Component Value Date/Time    NA 141 05/13/2015 0905    K 4.3 05/13/2015 0905    CL 103 05/13/2015 0905    CO2 24 05/13/2015 0905    BUN 10 05/13/2015 0905    CREATININE 0.6 08/09/2018 1516        Component Value Date/Time    CALCIUM 9.3 05/13/2015 0905    ALKPHOS 74 08/09/2018 1516    AST 11 (L) 08/09/2018 1516    ALT 12  08/09/2018 1516    BILITOT 0.3 08/09/2018 1516          Lab Results   Component Value Date    ANA Negative 05/04/2018    SEDRATE 48 (H) 08/09/2018     No results found for: CKTOTAL  Lab Results   Component Value Date    TSH 2.17 03/25/2010         A/P- See above.

## 2018-10-04 NOTE — Telephone Encounter (Signed)
LM for pt to schedule ov for anxiety.

## 2018-10-05 ENCOUNTER — Encounter

## 2018-10-05 LAB — COMPREHENSIVE METABOLIC PANEL
ALT: 29 U/L (ref 10–40)
AST: 22 U/L (ref 15–37)
Albumin/Globulin Ratio: 1.6 (ref 1.1–2.2)
Albumin: 4.4 g/dL (ref 3.4–5.0)
Alkaline Phosphatase: 79 U/L (ref 40–129)
Anion Gap: 14 (ref 3–16)
BUN: 6 mg/dL — ABNORMAL LOW (ref 7–20)
CO2: 21 mmol/L (ref 21–32)
Calcium: 9.6 mg/dL (ref 8.3–10.6)
Chloride: 99 mmol/L (ref 99–110)
Creatinine: 0.6 mg/dL (ref 0.6–1.1)
GFR African American: >60 (ref >60)
GFR Non-African American: >60 (ref >60)
Globulin: 2.8 g/dL
Glucose: 83 mg/dL (ref 70–99)
Potassium: 4.2 mmol/L (ref 3.5–5.1)
Sodium: 134 mmol/L — ABNORMAL LOW (ref 136–145)
Total Bilirubin: 0.4 mg/dL (ref 0.0–1.0)
Total Protein: 7.2 g/dL (ref 6.4–8.2)

## 2018-10-05 LAB — CBC WITH AUTO DIFFERENTIAL
Basophils %: 0.4 %
Basophils Absolute: 0 10*3/uL (ref 0.0–0.2)
Eosinophils %: 0.5 %
Eosinophils Absolute: 0 10*3/uL (ref 0.0–0.6)
Hematocrit: 44.3 % (ref 36.0–48.0)
Hemoglobin: 15.4 g/dL (ref 12.0–16.0)
Lymphocytes %: 26.9 %
Lymphocytes Absolute: 2.3 10*3/uL (ref 1.0–5.1)
MCH: 31 pg (ref 26.0–34.0)
MCHC: 34.9 g/dL (ref 31.0–36.0)
MCV: 88.9 fL (ref 80.0–100.0)
MPV: 7.2 fL (ref 5.0–10.5)
Monocytes %: 6 %
Monocytes Absolute: 0.5 10*3/uL (ref 0.0–1.3)
Neutrophils %: 66.2 %
Neutrophils Absolute: 5.8 10*3/uL (ref 1.7–7.7)
Platelets: 319 10*3/uL (ref 135–450)
RBC: 4.98 M/uL (ref 4.00–5.20)
RDW: 13.7 % (ref 12.4–15.4)
WBC: 8.7 10*3/uL (ref 4.0–11.0)

## 2018-10-05 LAB — C-REACTIVE PROTEIN: CRP: 8.4 mg/L — ABNORMAL HIGH (ref 0.0–5.1)

## 2018-10-05 LAB — SEDIMENTATION RATE: Sed Rate: 33 mm/Hr — ABNORMAL HIGH (ref 0–20)

## 2018-10-05 MED ORDER — ETANERCEPT 50 MG/ML SC SOCT
50 MG/ML | CARTRIDGE | SUBCUTANEOUS | 3 refills | Status: DC
Start: 2018-10-05 — End: 2018-10-09

## 2018-10-05 MED ORDER — FLUOXETINE HCL 10 MG PO CAPS
10 MG | ORAL_CAPSULE | Freq: Every day | ORAL | 1 refills | Status: DC
Start: 2018-10-05 — End: 2018-11-21

## 2018-10-05 NOTE — Telephone Encounter (Signed)
Pt's Enbrel Mini was approved, see media, I've pended Rx to go to [facility]. Please approve

## 2018-10-06 ENCOUNTER — Encounter

## 2018-10-09 ENCOUNTER — Ambulatory Visit
Admit: 2018-10-09 | Discharge: 2018-10-09 | Payer: PRIVATE HEALTH INSURANCE | Attending: Family Medicine | Primary: Family Medicine

## 2018-10-09 ENCOUNTER — Encounter: Attending: Rheumatology | Primary: Family Medicine

## 2018-10-09 DIAGNOSIS — F411 Generalized anxiety disorder: Secondary | ICD-10-CM

## 2018-10-09 LAB — LUPUS ANTICOAGULANT REFLEX PANEL
PT D: 13.2 s (ref 12.0–15.5)
PTT D: 39 s (ref 32–48)
dRVVT Screen: 32 s — ABNORMAL LOW (ref 33–44)

## 2018-10-09 LAB — LUPUS ANTICOAGULANT

## 2018-10-09 LAB — QUANTIFERON, INCUBATED
QuantiFERON Mitogen: 7.03 IU/mL
QuantiFERON Nil: 0.01 IU/mL
Quantiferon TB Minus NIL: NEGATIVE
Quantiferon TB1 Minus NIL: 0.06 IU/mL (ref 0.00–0.34)
Quantiferon TB2 Minus NIL: 0.01 IU/mL (ref 0.00–0.34)

## 2018-10-09 MED ORDER — HYDROXYZINE HCL 25 MG PO TABS
25 MG | ORAL_TABLET | Freq: Three times a day (TID) | ORAL | 3 refills | Status: DC | PRN
Start: 2018-10-09 — End: 2019-08-19

## 2018-10-09 MED ORDER — ETANERCEPT 50 MG/ML SC SOCT
50 MG/ML | CARTRIDGE | SUBCUTANEOUS | 3 refills | Status: AC
Start: 2018-10-09 — End: ?

## 2018-10-09 MED ORDER — KETOROLAC TROMETHAMINE 10 MG PO TABS
10 MG | ORAL_TABLET | ORAL | 5 refills | Status: DC
Start: 2018-10-09 — End: 2019-08-19

## 2018-10-09 MED ORDER — BUSPIRONE HCL 5 MG PO TABS
5 MG | ORAL_TABLET | Freq: Three times a day (TID) | ORAL | 5 refills | Status: DC | PRN
Start: 2018-10-09 — End: 2019-08-19

## 2018-10-09 MED ORDER — RABEPRAZOLE SODIUM 20 MG PO TBEC
20 MG | ORAL_TABLET | Freq: Every day | ORAL | 1 refills | Status: DC
Start: 2018-10-09 — End: 2019-08-19

## 2018-10-09 NOTE — Telephone Encounter (Signed)
Rx pended to go to [facility]

## 2018-10-09 NOTE — Telephone Encounter (Signed)
From: Ara Kussmaul  To: Karle Plumber, MD  Sent: 10/06/2018 3:54 PM EST  Subject: Prescription Question    I got notification that the Enbrel was approved by [facility]. Do I need to do anything now or will you send prescription over to my pharmacy?    Thank you,  Peggy Sanders

## 2018-10-09 NOTE — Progress Notes (Signed)
Peggy Sanders is a 43 y.o. female    Chief Complaint   Patient presents with   ??? Depression   ??? Anxiety       HPI:    HPI    Depression and anxiety.  This is a chronic condition.  There is significant issue secondary to chronic pain.  She has seronegative rheumatoid arthritis.  The pain causes her to feel depressed and anxious.  She is moving to [place].  Overall the Prozac does work well and she does not want a dose increase at this time.  She would also like a breakthrough prescription for Atarax and/or BuSpar on hand.    GERD is stable.  There is a chronic condition.  She needs her AcipHex refilled.    ROS:    Review of Systems    BP 128/82    Pulse 70    Wt 273 lb (123.8 kg)    SpO2 98%    BMI 46.86 kg/m??     Physical Exam:    Physical Exam  Constitutional:       General: She is not in acute distress.     Appearance: Normal appearance. She is not ill-appearing.   Neurological:      Mental Status: She is alert.   Psychiatric:         Mood and Affect: Mood normal.         Behavior: Behavior normal.         Thought Content: Thought content normal.         Current Outpatient Medications   Medication Sig Dispense Refill   ??? Etanercept (ENBREL MINI) 50 MG/ML SOCT Inject 50 mg sc weekly. 4 Cartridge 3   ??? RABEprazole (ACIPHEX) 20 MG tablet Take 1 tablet by mouth daily 90 tablet 1   ??? ketorolac (TORADOL) 10 MG tablet TAKE ONE TABLET BY MOUTH EVERY 6 HOURS AS NEEDED FOR PAIN for up to 3 days per migraine episode. 30 tablet 5   ??? busPIRone (BUSPAR) 5 MG tablet Take 2 tablets by mouth 3 times daily as needed (anxiety) 60 tablet 5   ??? hydrOXYzine (ATARAX) 25 MG tablet Take 1 tablet by mouth 3 times daily as needed for Anxiety 30 tablet 3   ??? FLUoxetine (PROZAC) 10 MG capsule Take 1 capsule by mouth daily 90 capsule 1   ??? predniSONE (DELTASONE) 10 MG tablet 2 tab po daily x 7 days. 1.5 tab po daily x 7 days. 1 tab po daily x 7 days. 1/2 tab po daily 7 days and stop. 35 tablet 0   ??? tiZANidine (ZANAFLEX) 2 MG tablet  Take 1-2 tab time. 90 tablet 1   ??? methotrexate Sodium (RHEUMATREX) 50 MG/2ML chemo injection Inject 0.6 ml sc once a week. 2 vial 1   ??? folic acid (FOLVITE) 1 MG tablet Take 1 tablet by mouth daily Take 1 tab po daily. 90 tablet 1   ??? Insulin Syringe-Needle U-100 (INSULIN SYRINGE 1CC/30GX5/16") 30G X 5/16" 1 ML MISC Dispense 1 Box of 10. Use 1 syringe with needle once a week to inject sc methotrexate. 1 each 1   ??? naproxen (NAPROSYN) 500 MG tablet Take 1 tablet by mouth 2 times daily (with meals) Prn pain 60 tablet 3   ??? hydroxychloroquine (PLAQUENIL) 200 MG tablet Take 1 tablet by mouth 2 times daily 60 tablet 3   ??? meclizine (ANTIVERT) 25 MG tablet Take 0.5-1 tablets by mouth 3 times daily as needed for Dizziness 30  tablet 0   ??? ondansetron (ZOFRAN) 4 MG tablet Take 1 tablet by mouth daily as needed for Nausea or Vomiting 30 tablet 0   ??? ondansetron (ZOFRAN ODT) 4 MG disintegrating tablet Take 1 tablet by mouth every 8 hours as needed for Nausea or Vomiting 30 tablet 1   ??? albuterol sulfate HFA (PROVENTIL HFA) 108 (90 Base) MCG/ACT inhaler INHALE 2 PUFFS BY MOUTH INTO THE LUNGS EVERY 6 HOURS AS NEEDED FOR WHEEZING 1 Inhaler 11   ??? cyclobenzaprine (FLEXERIL) 10 MG tablet Take 1 tablet by mouth every 8 hours as needed (prn) Sedation precautions 180 tablet 2   ??? rizatriptan (MAXALT) 10 MG tablet Take 10 mg by mouth once as needed for Migraine May repeat in 2 hours if needed     ??? metoclopramide (REGLAN) 10 MG tablet Take 1 tablet by mouth 4 times daily as needed (nausea) 120 tablet 3   ??? albuterol (PROVENTIL) (5 MG/ML) 0.5% nebulizer solution Take 1 mL by nebulization 4 times daily as needed for Wheezing. 120 each 0   ??? rizatriptan (MAXALT-MLT) 10 MG disintegrating tablet Take 1 tablet by mouth once as needed for Migraine May repeat in 2 hours if needed 9 tablet 5     No current facility-administered medications for this visit.        Assessment:    1. Generalized anxiety disorder    2. Migraine with aura and  without status migrainosus, not intractable    3. Gastroesophageal reflux disease with esophagitis        Plan:    1. Generalized anxiety disorder  Stable. Continue current medications.   - busPIRone (BUSPAR) 5 MG tablet; Take 2 tablets by mouth 3 times daily as needed (anxiety)  Dispense: 60 tablet; Refill: 5  - hydrOXYzine (ATARAX) 25 MG tablet; Take 1 tablet by mouth 3 times daily as needed for Anxiety  Dispense: 30 tablet; Refill: 3    2. Migraine with aura and without status migrainosus, not intractable  - RABEprazole (ACIPHEX) 20 MG tablet; Take 1 tablet by mouth daily  Dispense: 90 tablet; Refill: 1  - ketorolac (TORADOL) 10 MG tablet; TAKE ONE TABLET BY MOUTH EVERY 6 HOURS AS NEEDED FOR PAIN for up to 3 days per migraine episode.  Dispense: 30 tablet; Refill: 5    3. Gastroesophageal reflux disease with esophagitis  - RABEprazole (ACIPHEX) 20 MG tablet; Take 1 tablet by mouth daily  Dispense: 90 tablet; Refill: 1      Return in about 2 months (around 12/08/2018) for Preventative.

## 2018-10-18 MED ORDER — METHOTREXATE SODIUM 50 MG/2ML IJ SOLN
50 MG/2ML | INTRAMUSCULAR | 0 refills | Status: AC
Start: 2018-10-18 — End: ?

## 2018-10-18 NOTE — Telephone Encounter (Signed)
Last OV 10/04/18  Future OV 12/06/18  Recent Labs 10/05/18

## 2018-11-15 MED ORDER — PREDNISONE 10 MG PO TABS
10 MG | ORAL_TABLET | ORAL | 0 refills | Status: DC
Start: 2018-11-15 — End: 2018-11-21

## 2018-11-15 NOTE — Telephone Encounter (Signed)
Last OV 10/04/2018  Next OV 12/06/2018    Patient is asking for refill her hands are swelling up .

## 2018-11-21 MED ORDER — PREDNISONE 10 MG PO TABS
10 MG | ORAL_TABLET | ORAL | 0 refills | Status: DC
Start: 2018-11-21 — End: 2019-03-06

## 2018-11-21 MED ORDER — FLUOXETINE HCL 10 MG PO CAPS
10 MG | ORAL_CAPSULE | Freq: Every day | ORAL | 1 refills | Status: DC
Start: 2018-11-21 — End: 2019-08-19

## 2018-11-21 NOTE — Telephone Encounter (Signed)
From: Ara Kussmaul  To: Sotirios Koros, DO  Sent: 11/21/2018 9:05 AM EST  Subject: Prescription Question    Dr. Zerita Boers,    During the move to [place] I can't find my prescription for my Prozac. I haven't had it for 4 days now. Is there away that you could call in a prescription to my new pharmacy? If so their number is [phone].    Thank you  Peggy Sanders   [phone]

## 2018-11-21 NOTE — Telephone Encounter (Signed)
Refill Request     Last Seen: 10/09/2018    Last Written: 10/05/18    Next Appointment:   Future Appointments   Date Time Provider Department Center   12/06/2018  2:30 PM Karle Plumber, MD [facility]             Requested Prescriptions     Pending Prescriptions Disp Refills   ??? FLUoxetine (PROZAC) 10 MG capsule 90 capsule 1     Sig: Take 1 capsule by mouth daily

## 2018-11-21 NOTE — Telephone Encounter (Signed)
Pt called stating that during her move to [place], prednisone script that was recently ordered, got lost in a "pod" and will not be delivered to her new home until 12/03/18. Pt requesting another Rx be sent to the new [facility] pharmacy so she doesn't flare. The Rx was pended for approval to go to correct pharmacy.

## 2018-12-06 ENCOUNTER — Encounter: Attending: Rheumatology | Primary: Family Medicine

## 2018-12-19 NOTE — Telephone Encounter (Signed)
Received medical records req from [employer].  She has never been a patient in our office.     Sent a fax to notify them.

## 2018-12-26 ENCOUNTER — Encounter: Attending: Rheumatology | Primary: Family Medicine

## 2018-12-26 MED ORDER — TIZANIDINE HCL 2 MG PO TABS
2 MG | ORAL_TABLET | ORAL | 0 refills | Status: DC
Start: 2018-12-26 — End: 2019-10-19

## 2018-12-26 NOTE — Telephone Encounter (Signed)
Last OV 10/04/18  Cancelled OV 12/06/18  No Future OV   Recent Labs 10/05/18

## 2018-12-26 NOTE — Telephone Encounter (Signed)
video visit please.

## 2018-12-26 NOTE — Telephone Encounter (Signed)
Last OV 10/04/18  Cancelled OV 12/06/18  No Future OV   Recent Labs 10/05/18

## 2018-12-27 ENCOUNTER — Telehealth: Admit: 2018-12-27 | Payer: PRIVATE HEALTH INSURANCE | Attending: Rheumatology | Primary: Family Medicine

## 2018-12-27 DIAGNOSIS — M0609 Rheumatoid arthritis without rheumatoid factor, multiple sites: Secondary | ICD-10-CM

## 2018-12-27 MED ORDER — METHOTREXATE SODIUM 50 MG/2ML IJ SOLN
50 MG/2ML | INTRAMUSCULAR | 2 refills | Status: DC
Start: 2018-12-27 — End: 2019-10-22

## 2018-12-27 MED ORDER — HYDROXYCHLOROQUINE SULFATE 200 MG PO TABS
200 MG | ORAL_TABLET | Freq: Two times a day (BID) | ORAL | 2 refills | Status: AC
Start: 2018-12-27 — End: ?

## 2018-12-27 MED ORDER — INSULIN SYRINGE 30G X 5/16" 1 ML MISC
3 refills | Status: DC
Start: 2018-12-27 — End: 2019-10-22

## 2018-12-27 NOTE — Progress Notes (Signed)
12/27/2018    TELEHEALTH EVALUATION -- Audio/Visual (During COVID-19 public health emergency)    HPI:    Peggy Sanders (DOB:  08/26/1976) has requested an audio/video evaluation for the following concern(s):  management of RA.   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 She lost medical coverage and could not afford Humira but continues to take Methotrexate and Plaquenil. Tolerating meds well. Due for labs.    Review of Systems    Prior to Visit Medications    Medication Sig Taking? Authorizing Provider   tiZANidine (ZANAFLEX) 2 MG tablet TAKE ONE TO TWO TABLETS BY MOUTH EVERY DAY  Karle Plumber, MD   predniSONE (DELTASONE) 10 MG tablet 2 tab po daily x 7 days. 1.5 tab po daily x 7 days. 1 tab po daily x 7 days. 1/2 tab po daily 7 days and stop.  Karle Plumber, MD   FLUoxetine (PROZAC) 10 MG capsule Take 1 capsule by mouth daily  Sotirios Koros, DO   methotrexate Sodium (RHEUMATREX) 50 MG/2ML chemo injection INJECT 0.6ML SUBCUTANEOUSLY ONCE A WEEK  Karle Plumber, MD   Etanercept (ENBREL MINI) 50 MG/ML SOCT Inject 50 mg sc weekly.  Karle Plumber, MD   RABEprazole (ACIPHEX) 20 MG tablet Take 1 tablet by mouth daily  Sotirios Koros, DO   ketorolac (TORADOL) 10 MG tablet TAKE ONE TABLET BY MOUTH EVERY 6 HOURS AS NEEDED FOR PAIN for up to 3 days per migraine episode.  Sotirios Koros, DO   busPIRone (BUSPAR) 5 MG tablet Take 2 tablets by mouth 3 times daily as needed (anxiety)  Sotirios Koros, DO   hydrOXYzine (ATARAX) 25 MG tablet Take 1 tablet by mouth 3 times daily as needed for Anxiety  Sotirios Koros, DO   folic acid (FOLVITE) 1 MG tablet Take 1 tablet by mouth daily Take 1 tab po daily.  Karle Plumber, MD   Insulin Syringe-Needle U-100 (INSULIN SYRINGE 1CC/30GX5/16") 30G X 5/16" 1 ML MISC Dispense 1 Box of 10. Use 1 syringe with needle once a week to inject sc methotrexate.  Karle Plumber, MD   naproxen (NAPROSYN) 500 MG tablet Take 1  tablet by mouth 2 times daily (with meals) Prn pain  Sotirios Koros, DO   hydroxychloroquine (PLAQUENIL) 200 MG tablet Take 1 tablet by mouth 2 times daily  Karle Plumber, MD   meclizine (ANTIVERT) 25 MG tablet Take 0.5-1 tablets by mouth 3 times daily as needed for Dizziness  Sotirios Koros, DO   ondansetron (ZOFRAN) 4 MG tablet Take 1 tablet by mouth daily as needed for Nausea or Vomiting  Sotirios Koros, DO   ondansetron (ZOFRAN ODT) 4 MG disintegrating tablet Take 1 tablet by mouth every 8 hours as needed for Nausea or Vomiting  Sotirios Koros, DO   rizatriptan (MAXALT-MLT) 10 MG disintegrating tablet Take 1 tablet by mouth once as needed for Migraine May repeat in 2 hours if needed  Sotirios Koros, DO   albuterol sulfate HFA (PROVENTIL HFA) 108 (90 Base) MCG/ACT inhaler INHALE 2 PUFFS BY MOUTH INTO THE LUNGS EVERY 6 HOURS AS NEEDED FOR WHEEZING  Sotirios Koros, DO   cyclobenzaprine (FLEXERIL) 10 MG tablet Take 1 tablet by mouth every 8 hours as needed (prn) Sedation precautions  Modena Jansky, MD   rizatriptan (MAXALT) 10 MG tablet Take 10 mg by mouth once as needed for Migraine May repeat in 2 hours if needed  Historical Provider, MD  metoclopramide (REGLAN) 10 MG tablet Take 1 tablet by mouth 4 times daily as needed (nausea)  Modena Janskyonald Rae Williams, MD   albuterol (PROVENTIL) (5 MG/ML) 0.5% nebulizer solution Take 1 mL by nebulization 4 times daily as needed for Wheezing.  Gwendel HansonKathryn M Peeden, MD       Social History     Tobacco Use   ??? Smoking status: Current Every Day Smoker     Packs/day: 0.50     Types: Cigarettes     Last attempt to quit: 04/20/2009     Years since quitting: 9.6   ??? Smokeless tobacco: Never Used   ??? Tobacco comment: counseled 11/07/13   Substance Use Topics   ??? Alcohol use: Yes     Comment: three times a year   ??? Drug use: No            PHYSICAL EXAMINATION:  [ INSTRUCTIONS:  "[x] " Indicates a positive item  "[] " Indicates a negative item  -- DELETE ALL ITEMS NOT EXAMINED]  Vital Signs:  (As obtained by patient/caregiver or practitioner observation)    Blood pressure-  Heart rate-    Respiratory rate-    Temperature-  Pulse oximetry-     Constitutional: [x]  Appears well-developed and well-nourished [x]  No apparent distress      []  Abnormal-   Mental status  [x]  Alert and awake  [x]  Oriented to person/place/time [x] Able to follow commands      Eyes:  EOM    []   Normal  []  Abnormal-  Sclera  []   Normal  []  Abnormal -         Discharge []   None visible  []  Abnormal -    HENT:   [x]  Normocephalic, atraumatic.  []  Abnormal   []  Mouth/Throat: Mucous membranes are moist.     External Ears []  Normal  []  Abnormal-     Neck: []  No visualized mass     Pulmonary/Chest: [x]  Respiratory effort normal.  [x]  No visualized signs of difficulty breathing or respiratory distress        []  Abnormal-      Musculoskeletal:   []  Normal gait with no signs of ataxia         []  Normal range of motion of neck        []  Abnormal-   No obvious MK finding to show in video visit.    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 Use 1 syringe with needle once a week to inject sc methotrexate.      RA- stable. Stay on current meds.  Due for labs, she will call office in next few days so that lab orders could be faxed to the needed place.    No follow-ups on file.    Peggy Sanders is a 43 y.o. female being evaluated by a Virtual Visit (video visit) encounter to address concerns as mentioned above.  A caregiver was present when appropriate. Due to this being a Scientist, research (medical) (During COVID-19 public health emergency), evaluation of the following organ systems was limited: Vitals/Constitutional/EENT/Resp/CV/GI/GU/MS/Neuro/Skin/Heme-Lymph-Imm.  Pursuant to the emergency declaration under the [facility] Act and the [employer], 1135 waiver authority and the [employer] and [employer] Act, this Virtual Visit was conducted with patient's (and/or legal guardian's) consent, to reduce the patient's risk of exposure to COVID-19 and provide necessary medical care.  The patient (and/or legal guardian) has also been advised to contact this office for worsening conditions or problems, and seek emergency medical treatment and/or call 911 if deemed necessary.     Services were provided through a video synchronous discussion virtually to substitute for in-person clinic visit. Patient and provider were located at their individual homes.    --Karle Plumber, MD on 12/27/2018 at 1:40 PM    An electronic signature was used to authenticate this note.

## 2019-03-06 MED ORDER — PREDNISONE 10 MG PO TABS
10 MG | ORAL_TABLET | ORAL | 0 refills | Status: DC
Start: 2019-03-06 — End: 2019-08-19

## 2019-03-06 NOTE — Telephone Encounter (Signed)
Pt called requesting steroid medication. Pt states woke up this morning and both hands are swollen, unable to close or make fist. Pt uses [facility] and would like to be called when medication has been sent. [phone]. Previous tapered dose was pended for approval

## 2019-03-13 ENCOUNTER — Telehealth: Admit: 2019-03-13 | Discharge: 2019-03-13 | Attending: Family Medicine | Primary: Family Medicine

## 2019-03-13 DIAGNOSIS — N3001 Acute cystitis with hematuria: Secondary | ICD-10-CM

## 2019-03-13 MED ORDER — PHENAZOPYRIDINE HCL 100 MG PO TABS
100 MG | ORAL_TABLET | Freq: Three times a day (TID) | ORAL | 0 refills | Status: AC | PRN
Start: 2019-03-13 — End: ?

## 2019-03-13 MED ORDER — CIPROFLOXACIN HCL 500 MG PO TABS
500 MG | ORAL_TABLET | Freq: Two times a day (BID) | ORAL | 0 refills | Status: AC
Start: 2019-03-13 — End: 2019-03-23

## 2019-03-13 NOTE — Progress Notes (Signed)
Peggy KussmaulKimberley A Sanders is a 43 y.o. female    Chief Complaint   Patient presents with   ??? Urinary Tract Infection       HPI:    Urinary Tract Infection    This is a new problem. The current episode started in the past 7 days. The problem has been gradually worsening. The quality of the pain is described as burning. There has been no fever. She is sexually active. There is no history of pyelonephritis. Associated symptoms include frequency and hematuria. Pertinent negatives include no chills. She has tried antibiotics (Keflex) for the symptoms. The treatment provided mild relief. There is no history of recurrent UTIs.     CT scan was negative for kidney stones in ER. Keflex did not completely treat the infection.     ROS:    Review of Systems   Constitutional: Negative for chills.   Genitourinary: Positive for frequency and hematuria.       There were no vitals taken for this visit.    Physical Exam:    Physical Exam  Psychiatric:         Speech: Speech normal.         Current Outpatient Medications   Medication Sig Dispense Refill   ??? ciprofloxacin (CIPRO) 500 MG tablet Take 1 tablet by mouth 2 times daily for 10 days 20 tablet 0   ??? phenazopyridine (PYRIDIUM) 100 MG tablet Take 1 tablet by mouth 3 times daily as needed for Pain 9 tablet 0   ??? hydroxychloroquine (PLAQUENIL) 200 MG tablet Take 1 tablet by mouth 2 times daily 180 tablet 2   ??? methotrexate Sodium (RHEUMATREX) 50 MG/2ML chemo injection Inject 0.7 ml 2 vial 2   ??? Insulin Syringe-Needle U-100 (INSULIN SYRINGE 1CC/30GX5/16") 30G X 5/16" 1 ML MISC Dispense 1 Box of 10. Use 1 syringe with needle once a week to inject sc methotrexate. 1 each 3   ??? tiZANidine (ZANAFLEX) 2 MG tablet TAKE ONE TO TWO TABLETS BY MOUTH EVERY DAY 60 tablet 0   ??? FLUoxetine (PROZAC) 10 MG capsule Take 1 capsule by mouth daily 90 capsule 1   ??? methotrexate Sodium (RHEUMATREX) 50 MG/2ML chemo injection INJECT 0.6ML SUBCUTANEOUSLY ONCE A WEEK 4 vial 0   ??? Etanercept (ENBREL MINI) 50 MG/ML  SOCT Inject 50 mg sc weekly. 4 Cartridge 3   ??? RABEprazole (ACIPHEX) 20 MG tablet Take 1 tablet by mouth daily 90 tablet 1   ??? ketorolac (TORADOL) 10 MG tablet TAKE ONE TABLET BY MOUTH EVERY 6 HOURS AS NEEDED FOR PAIN for up to 3 days per migraine episode. 30 tablet 5   ??? busPIRone (BUSPAR) 5 MG tablet Take 2 tablets by mouth 3 times daily as needed (anxiety) 60 tablet 5   ??? hydrOXYzine (ATARAX) 25 MG tablet Take 1 tablet by mouth 3 times daily as needed for Anxiety 30 tablet 3   ??? folic acid (FOLVITE) 1 MG tablet Take 1 tablet by mouth daily Take 1 tab po daily. 90 tablet 1   ??? Insulin Syringe-Needle U-100 (INSULIN SYRINGE 1CC/30GX5/16") 30G X 5/16" 1 ML MISC Dispense 1 Box of 10. Use 1 syringe with needle once a week to inject sc methotrexate. 1 each 1   ??? naproxen (NAPROSYN) 500 MG tablet Take 1 tablet by mouth 2 times daily (with meals) Prn pain 60 tablet 3   ??? hydroxychloroquine (PLAQUENIL) 200 MG tablet Take 1 tablet by mouth 2 times daily 60 tablet 3   ??? meclizine (  ANTIVERT) 25 MG tablet Take 0.5-1 tablets by mouth 3 times daily as needed for Dizziness 30 tablet 0   ??? ondansetron (ZOFRAN) 4 MG tablet Take 1 tablet by mouth daily as needed for Nausea or Vomiting 30 tablet 0   ??? ondansetron (ZOFRAN ODT) 4 MG disintegrating tablet Take 1 tablet by mouth every 8 hours as needed for Nausea or Vomiting 30 tablet 1   ??? albuterol sulfate HFA (PROVENTIL HFA) 108 (90 Base) MCG/ACT inhaler INHALE 2 PUFFS BY MOUTH INTO THE LUNGS EVERY 6 HOURS AS NEEDED FOR WHEEZING 1 Inhaler 11   ??? cyclobenzaprine (FLEXERIL) 10 MG tablet Take 1 tablet by mouth every 8 hours as needed (prn) Sedation precautions 180 tablet 2   ??? rizatriptan (MAXALT) 10 MG tablet Take 10 mg by mouth once as needed for Migraine May repeat in 2 hours if needed     ??? metoclopramide (REGLAN) 10 MG tablet Take 1 tablet by mouth 4 times daily as needed (nausea) 120 tablet 3   ??? albuterol (PROVENTIL) (5 MG/ML) 0.5% nebulizer solution Take 1 mL by nebulization 4  times daily as needed for Wheezing. 120 each 0   ??? predniSONE (DELTASONE) 10 MG tablet 2 tab po daily x 7 days. 1.5 tab po daily x 7 days. 1 tab po daily x 7 days. 1/2 tab po daily 7 days and stop. 35 tablet 0   ??? rizatriptan (MAXALT-MLT) 10 MG disintegrating tablet Take 1 tablet by mouth once as needed for Migraine May repeat in 2 hours if needed 9 tablet 5     No current facility-administered medications for this visit.        Assessment:    1. Acute cystitis with hematuria        Plan:    1. Acute cystitis with hematuria  Treat with Cipro for 10 days. Increase water intake.   - ciprofloxacin (CIPRO) 500 MG tablet; Take 1 tablet by mouth 2 times daily for 10 days  Dispense: 20 tablet; Refill: 0      Patient to return to clinic if symptoms worsen or fail to improve.    Peggy Sanders is a 42 y.o. female evaluated via telephone on 03/13/2019.      Consent:  She and/or health care decision maker is aware that that she may receive a bill for this telephone service, depending on her insurance coverage, and has provided verbal consent to proceed: Yes      Documentation:  I communicated with the patient and/or health care decision maker about discussed above.   Details of this discussion including any medical advice provided: discussed above      I affirm this is a Patient Initiated Episode with a Patient who has not had a related appointment within my department in the past 7 days or scheduled within the next 24 hours.    Patient identification was verified at the start of the visit: Yes    Total Time: minutes: 11-20 minutes    Note: not billable if this call serves to triage the patient into an appointment for the relevant concern      Peggy Sanders

## 2019-03-13 NOTE — Telephone Encounter (Signed)
Scheduled.

## 2019-03-13 NOTE — Telephone Encounter (Signed)
[facility] received a call from:    Name of Caller: Kim  Relationship to patient:self  Organization name: na    Best contact number:     Reason for call:Pt states she was seen in er in [place] for a bad uti, was giving antibiotics states it was not enough still has uti would like a call from Dr. Zerita Boers please advise thank you.

## 2019-03-13 NOTE — Telephone Encounter (Signed)
Please schedule for video visit or telephone visit at [phone].

## 2019-03-28 ENCOUNTER — Encounter: Attending: Rheumatology | Primary: Family Medicine

## 2019-08-16 ENCOUNTER — Encounter

## 2019-08-19 ENCOUNTER — Encounter

## 2019-08-19 MED ORDER — ONDANSETRON HCL 4 MG PO TABS
4 MG | ORAL_TABLET | Freq: Every day | ORAL | 0 refills | Status: AC | PRN
Start: 2019-08-19 — End: ?

## 2019-08-19 MED ORDER — HYDROXYZINE HCL 25 MG PO TABS
25 MG | ORAL_TABLET | Freq: Three times a day (TID) | ORAL | 3 refills | Status: AC | PRN
Start: 2019-08-19 — End: ?

## 2019-08-19 MED ORDER — FLUOXETINE HCL 10 MG PO CAPS
10 MG | ORAL_CAPSULE | Freq: Every day | ORAL | 1 refills | Status: AC
Start: 2019-08-19 — End: ?

## 2019-08-19 MED ORDER — RIZATRIPTAN BENZOATE 10 MG PO TBDP
10 MG | ORAL_TABLET | Freq: Once | ORAL | 5 refills | Status: AC | PRN
Start: 2019-08-19 — End: 2019-08-19

## 2019-08-19 MED ORDER — BUSPIRONE HCL 5 MG PO TABS
5 MG | ORAL_TABLET | Freq: Three times a day (TID) | ORAL | 5 refills | Status: AC | PRN
Start: 2019-08-19 — End: ?

## 2019-08-19 MED ORDER — RABEPRAZOLE SODIUM 20 MG PO TBEC
20 MG | ORAL_TABLET | Freq: Every day | ORAL | 1 refills | Status: AC
Start: 2019-08-19 — End: ?

## 2019-08-19 MED ORDER — PREDNISONE 10 MG PO TABS
10 MG | ORAL_TABLET | ORAL | 0 refills | Status: AC
Start: 2019-08-19 — End: ?

## 2019-08-19 MED ORDER — KETOROLAC TROMETHAMINE 10 MG PO TABS
10 MG | ORAL_TABLET | ORAL | 5 refills | Status: AC
Start: 2019-08-19 — End: ?

## 2019-08-19 NOTE — Telephone Encounter (Signed)
Patient paged for pain and stiffness. In need of prednisone refill. Has done it before with good success. Needs other refills as well. Discussed finding rheumatologist in the future.

## 2019-09-06 NOTE — Telephone Encounter (Signed)
LVM on Ms. Stefanick's vm regarding records request.  Advised that the authorization to release records that was faxed to [place] was not signed and that [facility] will not release records w/o it.    Case [phone]

## 2019-09-25 ENCOUNTER — Encounter

## 2019-10-02 ENCOUNTER — Telehealth: Admit: 2019-10-02 | Discharge: 2019-10-02 | Attending: Rheumatology | Primary: Family Medicine

## 2019-10-02 DIAGNOSIS — M0609 Rheumatoid arthritis without rheumatoid factor, multiple sites: Secondary | ICD-10-CM

## 2019-10-02 MED ORDER — METHOTREXATE 2.5 MG PO TABS
2.5 MG | ORAL_TABLET | ORAL | 0 refills | Status: AC
Start: 2019-10-02 — End: ?

## 2019-10-02 MED ORDER — FOLIC ACID 1 MG PO TABS
1 MG | ORAL_TABLET | ORAL | 3 refills | Status: AC
Start: 2019-10-02 — End: ?

## 2019-10-02 MED ORDER — HUMIRA PEN 40 MG/0.4ML SC PNKT
40 MG/0.4ML | SUBCUTANEOUS | 3 refills | Status: AC
Start: 2019-10-02 — End: ?

## 2019-10-02 MED ORDER — PREDNISONE 10 MG PO TABS
10 MG | ORAL_TABLET | ORAL | 0 refills | Status: AC
Start: 2019-10-02 — End: ?

## 2019-10-19 ENCOUNTER — Encounter

## 2019-10-19 MED ORDER — NAPROXEN 500 MG PO TABS
500 MG | ORAL_TABLET | Freq: Two times a day (BID) | ORAL | 3 refills | Status: AC
Start: 2019-10-19 — End: ?

## 2019-10-22 MED ORDER — INSULIN SYRINGE 30G X 5/16" 1 ML MISC
3 refills | Status: AC
Start: 2019-10-22 — End: ?

## 2019-10-22 MED ORDER — TIZANIDINE HCL 2 MG PO TABS
2 MG | ORAL_TABLET | ORAL | 0 refills | Status: AC
Start: 2019-10-22 — End: ?

## 2019-10-22 MED ORDER — METHOTREXATE SODIUM 50 MG/2ML IJ SOLN
50 MG/2ML | INTRAMUSCULAR | 2 refills | Status: AC
Start: 2019-10-22 — End: ?

## 2019-10-22 NOTE — Telephone Encounter (Signed)
From: Ara Kussmaul  To: Karle Plumber, MD  Sent: 10/19/2019 4:32 PM EST  Subject: Prescription Question    I got the prescription. I tried the tabs of methotrexate again. They make me so sick. They make me vomit. Can we switch back to the injections like before?

## 2019-10-22 NOTE — Telephone Encounter (Signed)
Last OV 10/02/19  No Future OV Scheduled   Recent Labs 10/05/18

## 2020-08-11 IMAGING — DX KNEE 2 VIEWS LEFT
1 series · 2 of 2 positions shown · non-contrast
Comparison: None

KNEE 2 VIEWS LEFT, 08/11/2020 [DATE]: 
CLINICAL INDICATION:  History rheumatoid arthritis and lupus

[Series 1: AP · 0.14mm/px · 2 of 2 slices shown]
[im 1/2]
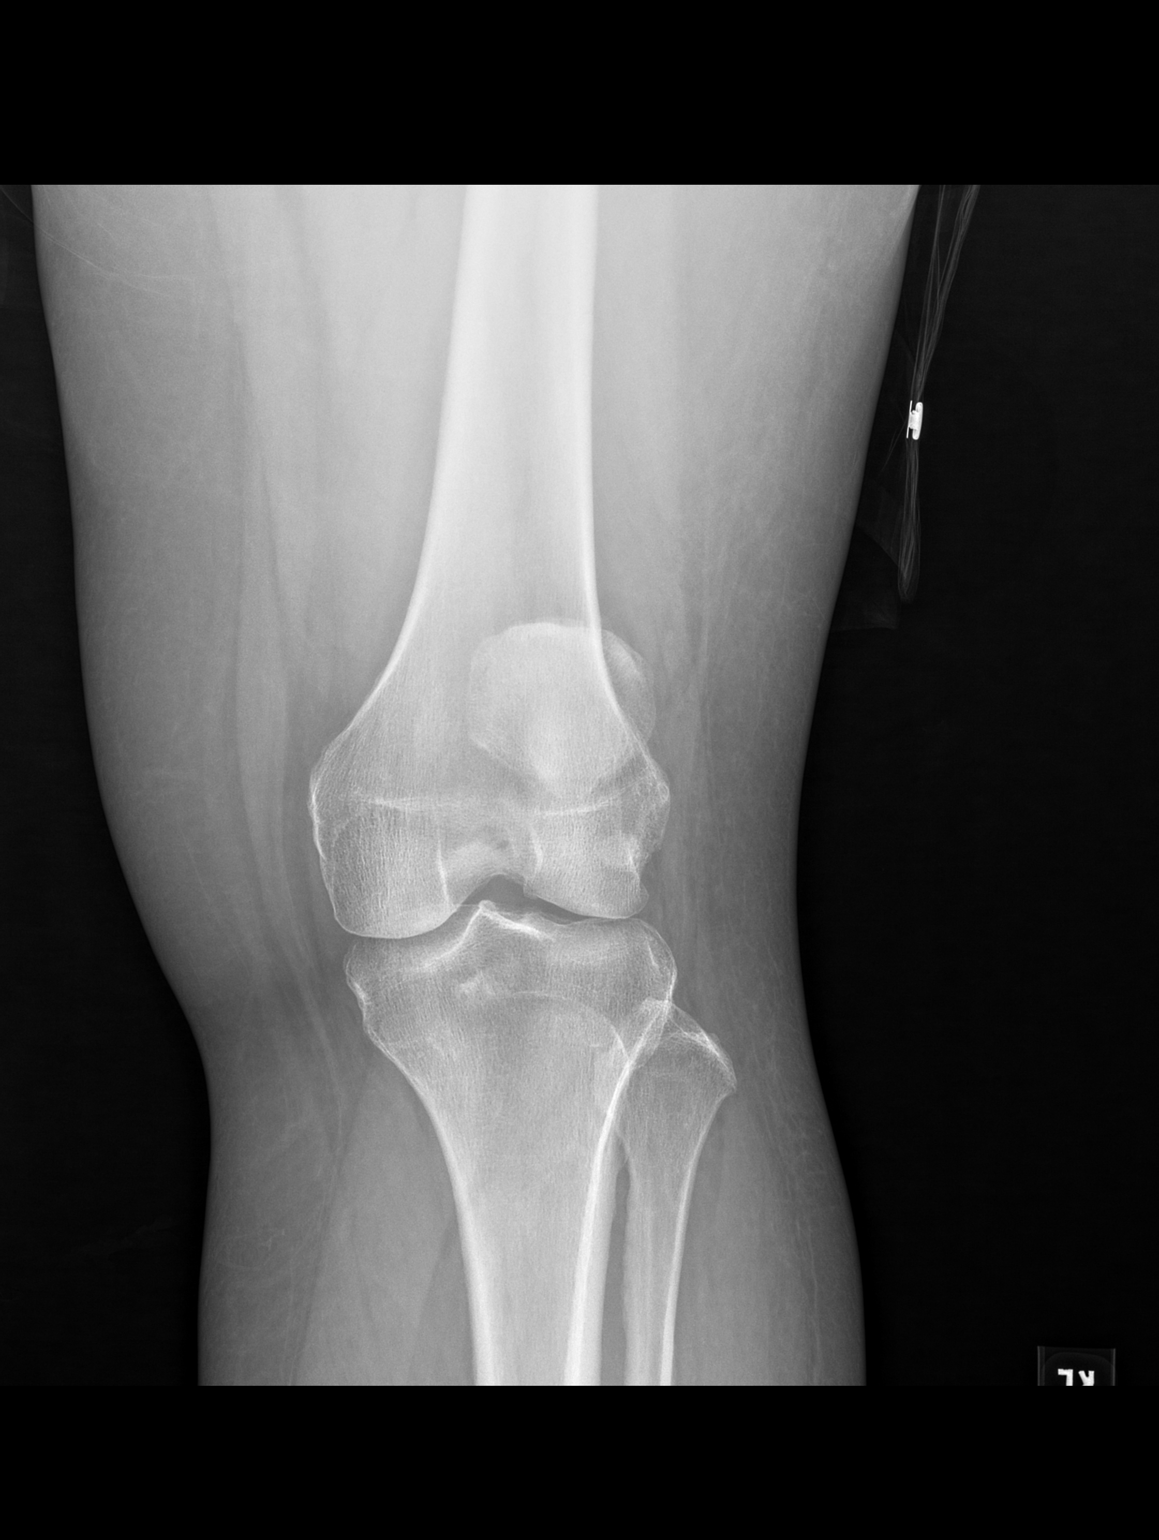
[im 2/2]
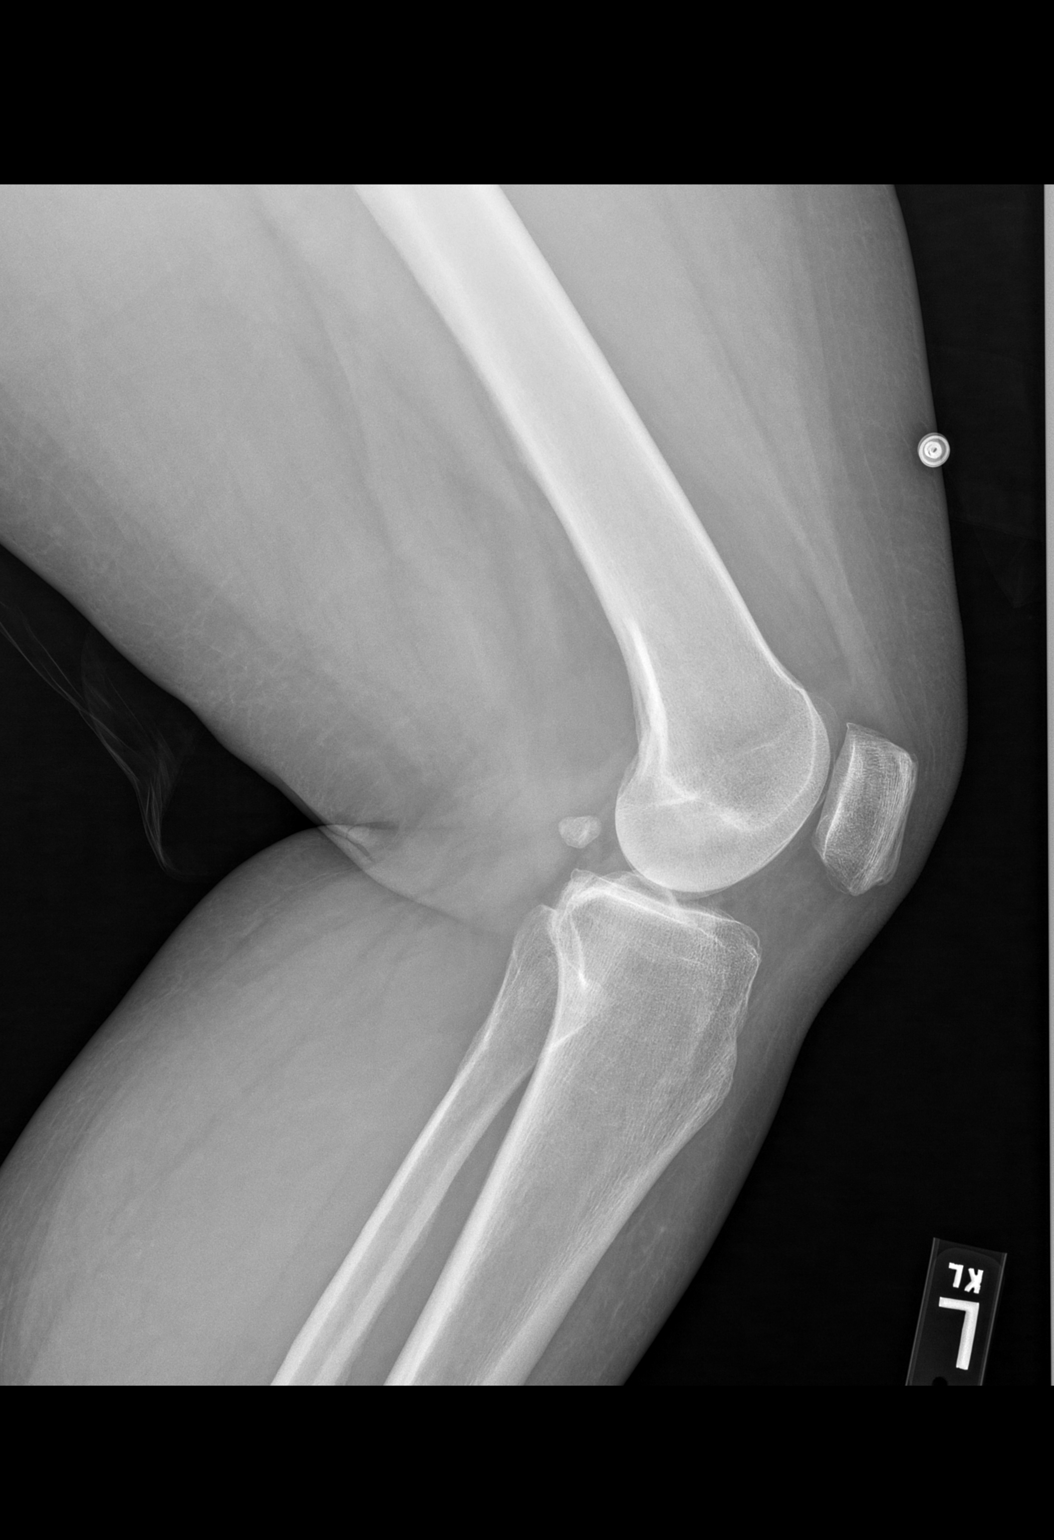

[2 of 2 positions shown; findings below may reference images not displayed]

FINDINGS: There are minor degenerative changes. No osteolysis. 
Fracture or dislocation. No joint effusion.
IMPRESSION: Minor degenerative changes of the knee.

## 2020-08-11 IMAGING — DX HAND RIGHT 2 VIEWS
1 series · 2 of 2 positions shown · non-contrast
Comparison: None

HAND RIGHT 2 VIEWS, 08/11/2020 [DATE]: 
CLINICAL INDICATION:  History of rheumatoid arthritis, lupus

[Series 1: AP · 0.14mm/px · 2 of 2 slices shown]
[im 1/2]
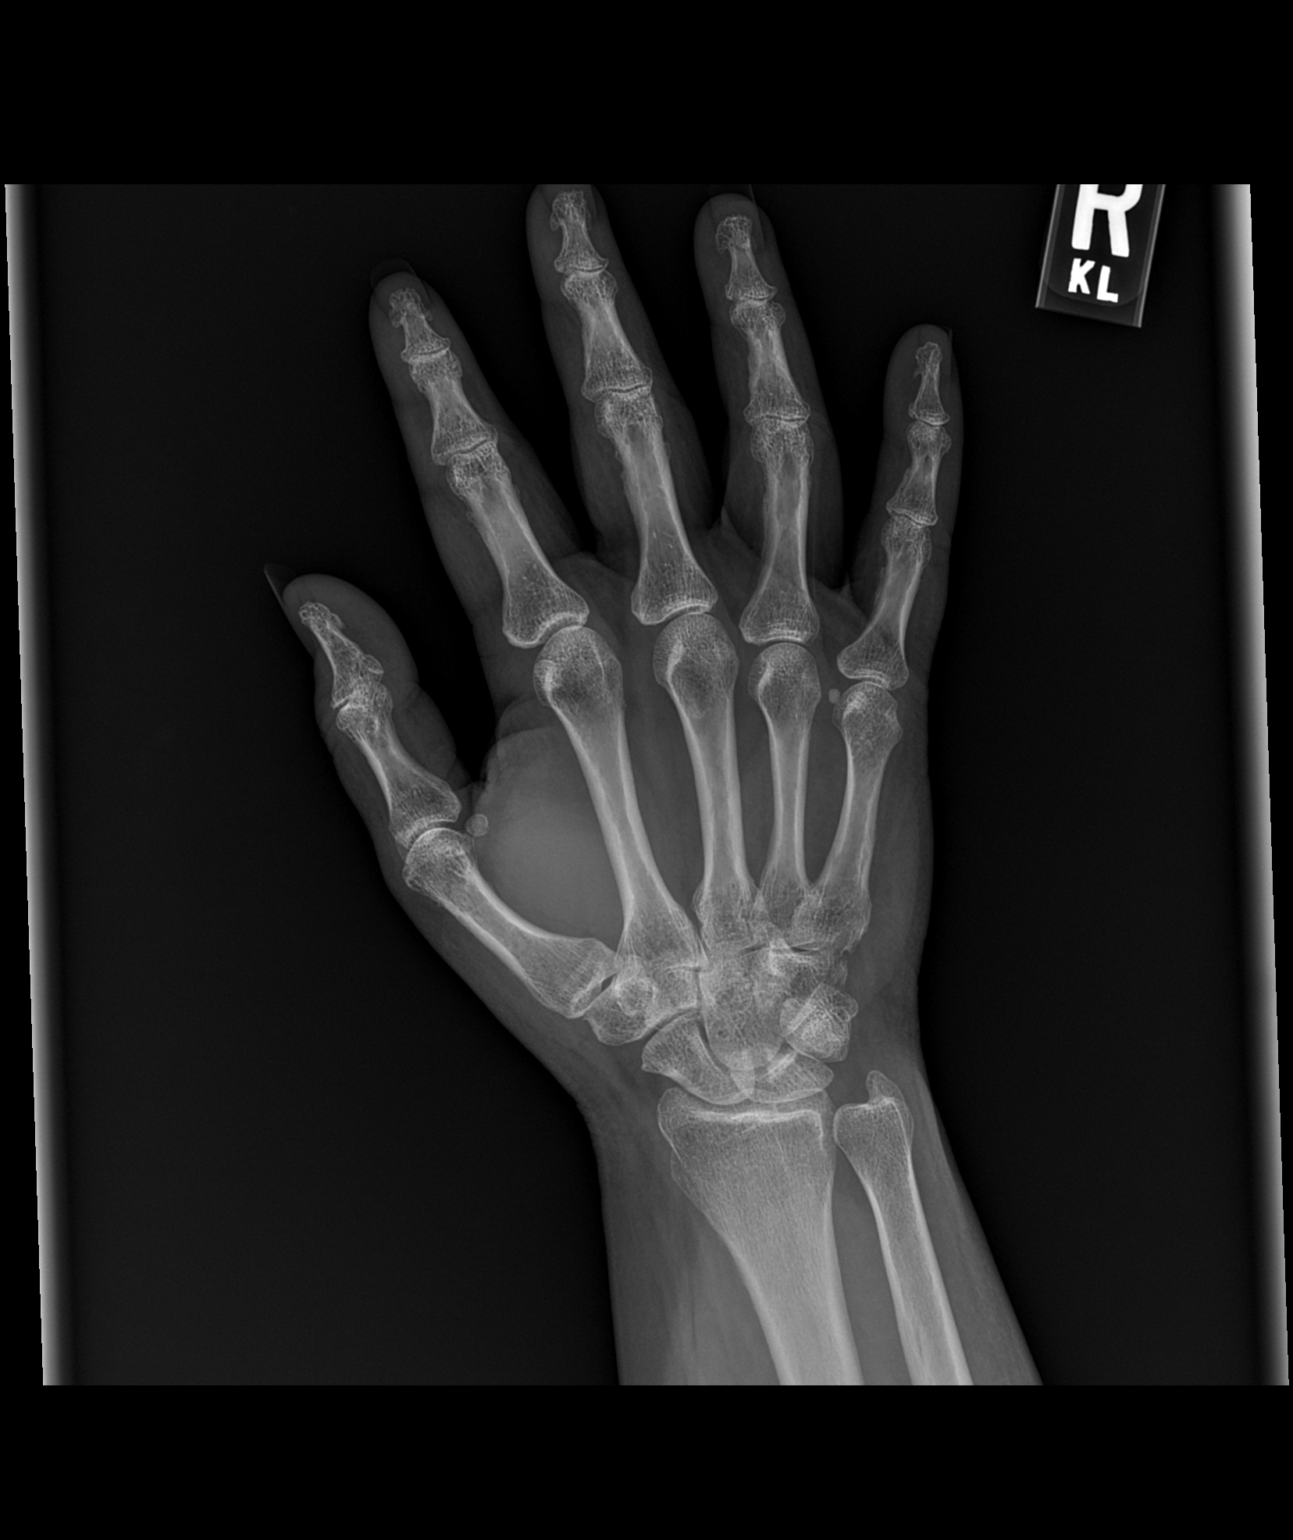
[im 2/2]
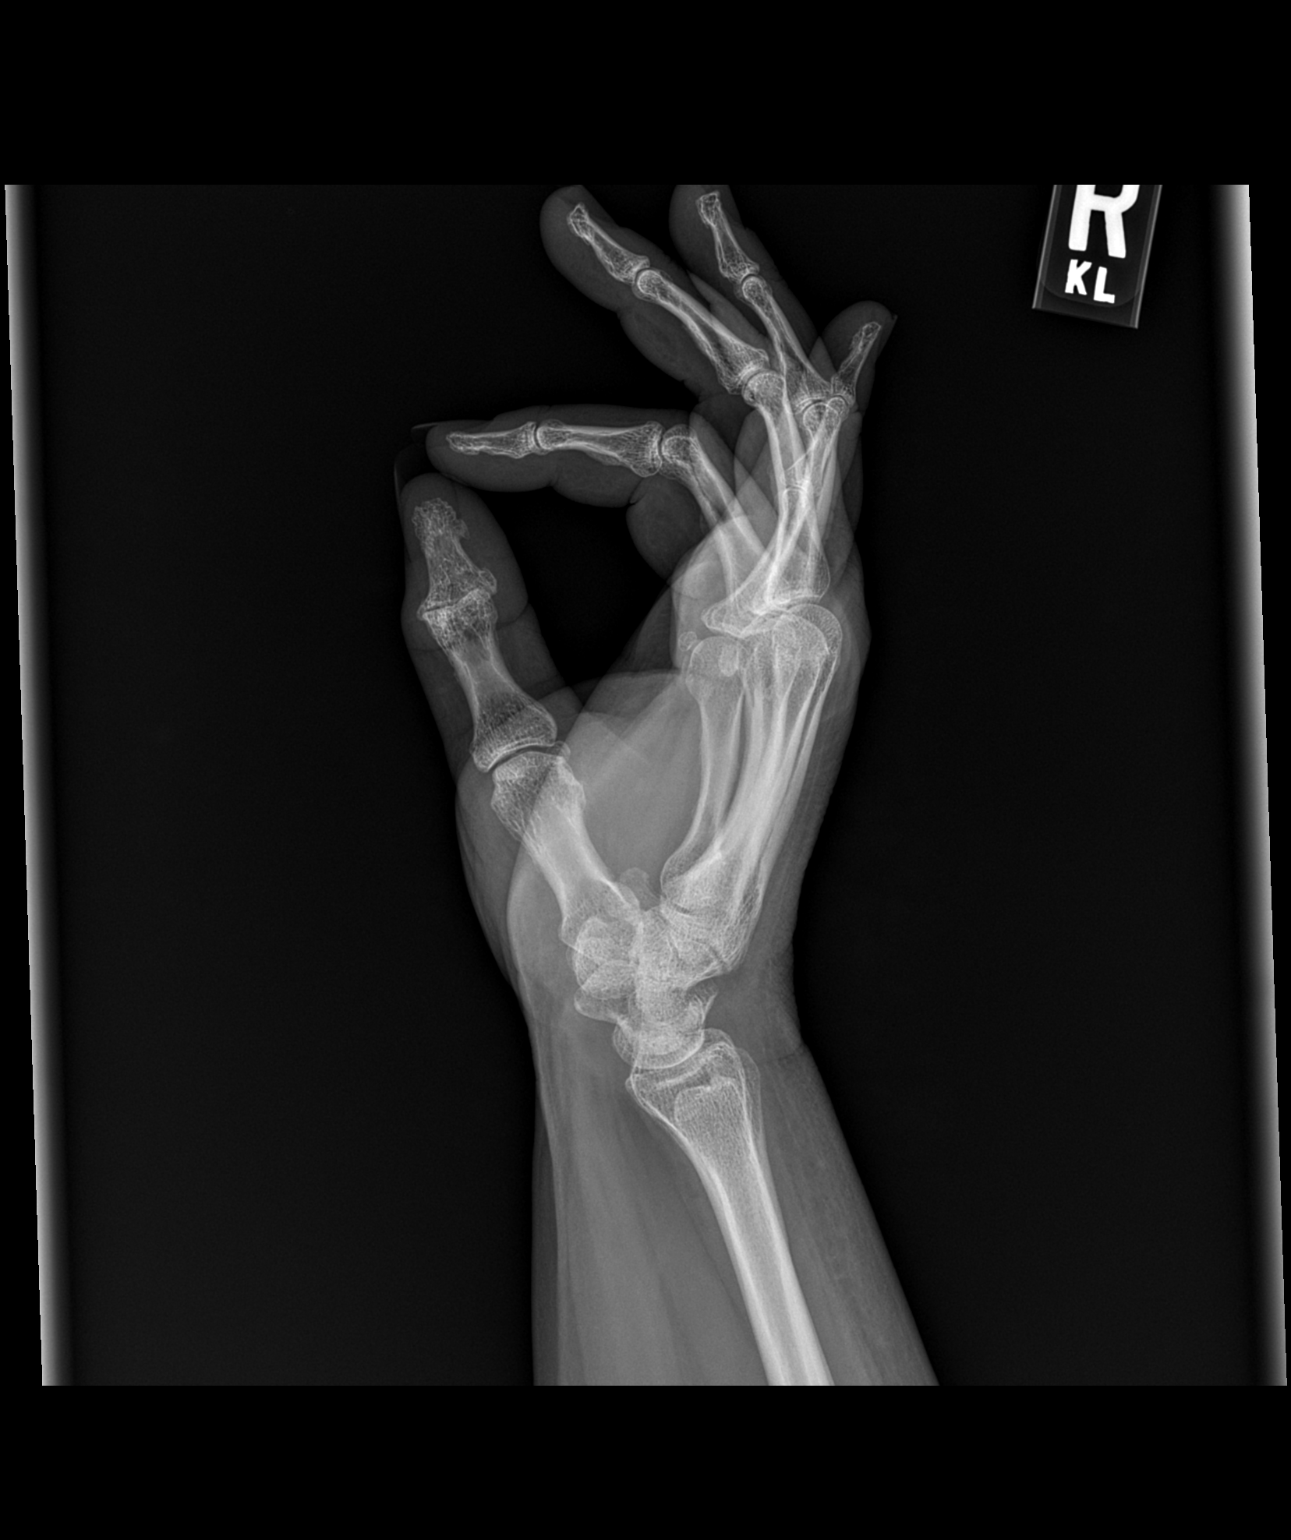

[2 of 2 positions shown; findings below may reference images not displayed]

FINDINGS: There are mild degenerative changes, including of the DIP and PIP 
articulations, and first MTP joint. There are carpal degenerative changes, 
appearing most pronounced at the triscaphe articulation. No specific findings 
indicative of rheumatoid involvement. There is no bone erosion or subluxation. 
No fracture. The radiocarpal joint is intact. There is no chondrocalcinosis.
IMPRESSION: Degenerative changes compatible with osteoarthritis..

## 2020-08-11 IMAGING — DX LUMBAR SPINE 2 VIEW
1 series · 2 of 2 positions shown · non-contrast
Comparison: None

LUMBAR SPINE 2 VIEW, 08/11/2020 [DATE]: 
CLINICAL INDICATION: History rheumatoid arthritis and lupus, cervical cancer

[Series 1: AP · 0.14mm/px · 2 of 2 slices shown]
[im 1/2]
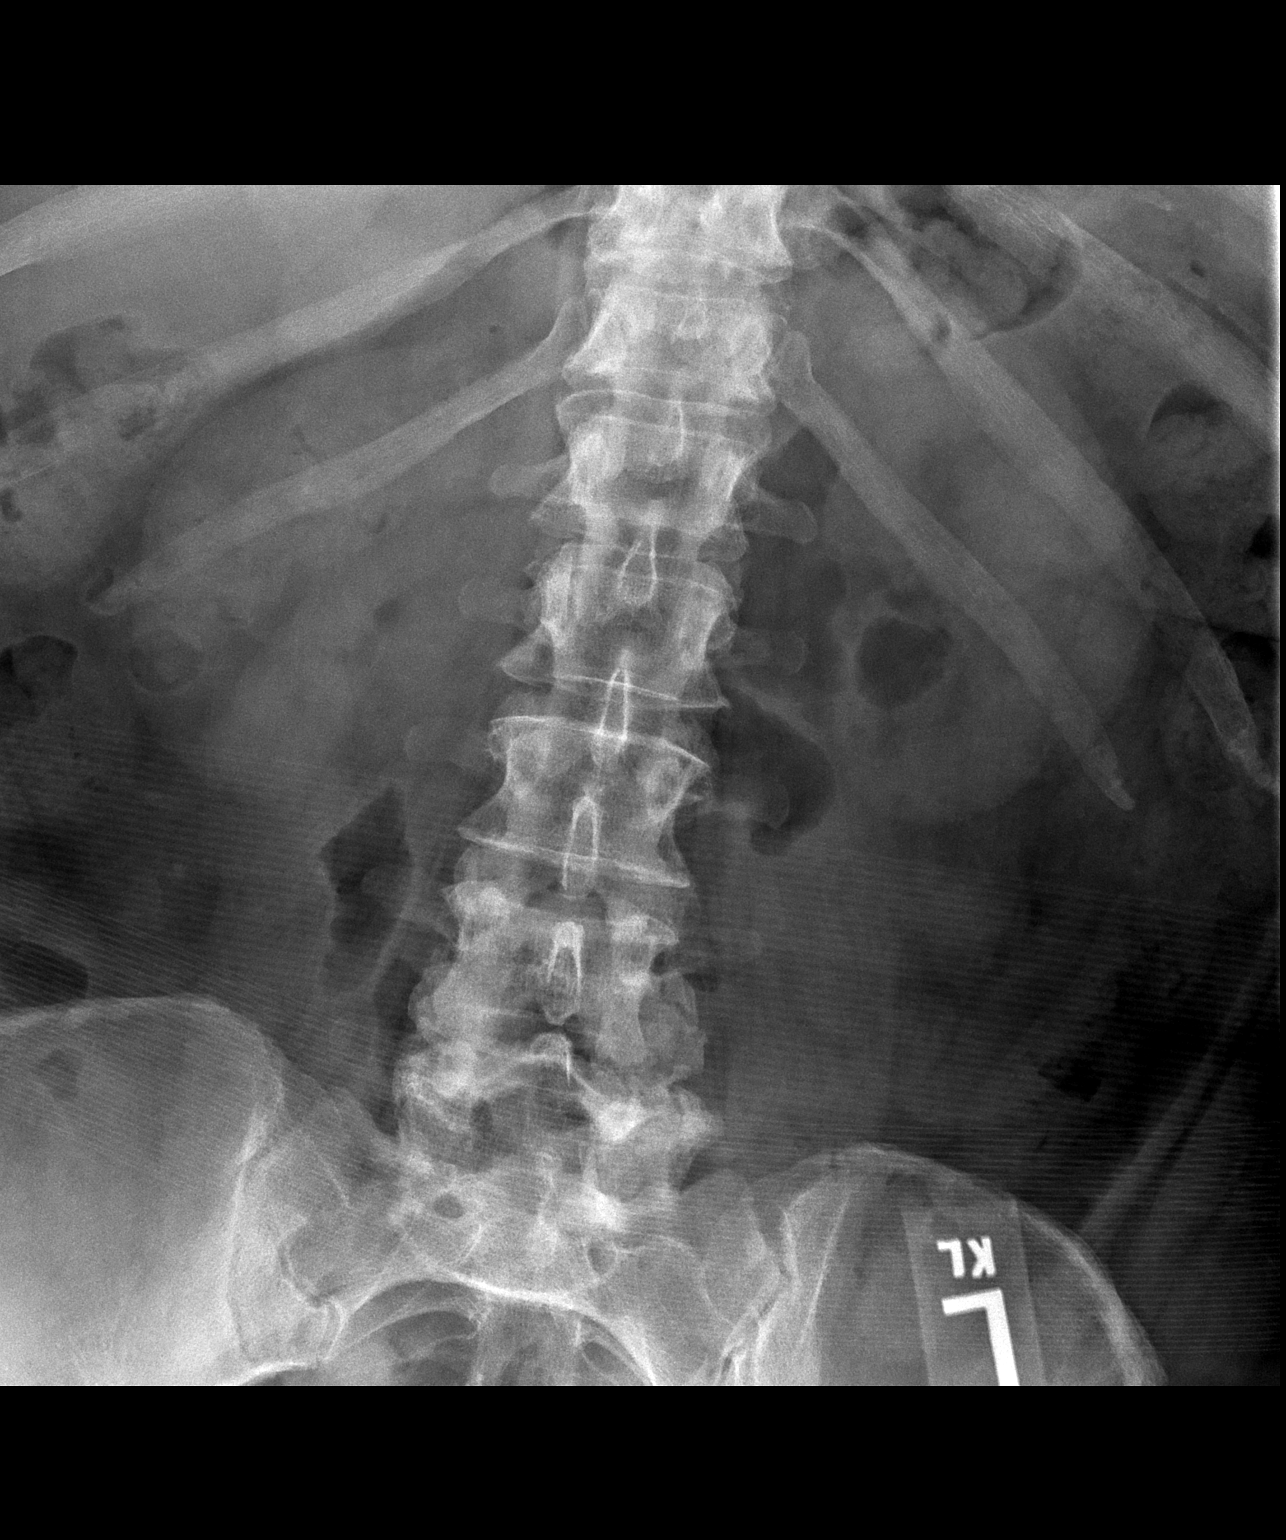
[im 2/2]
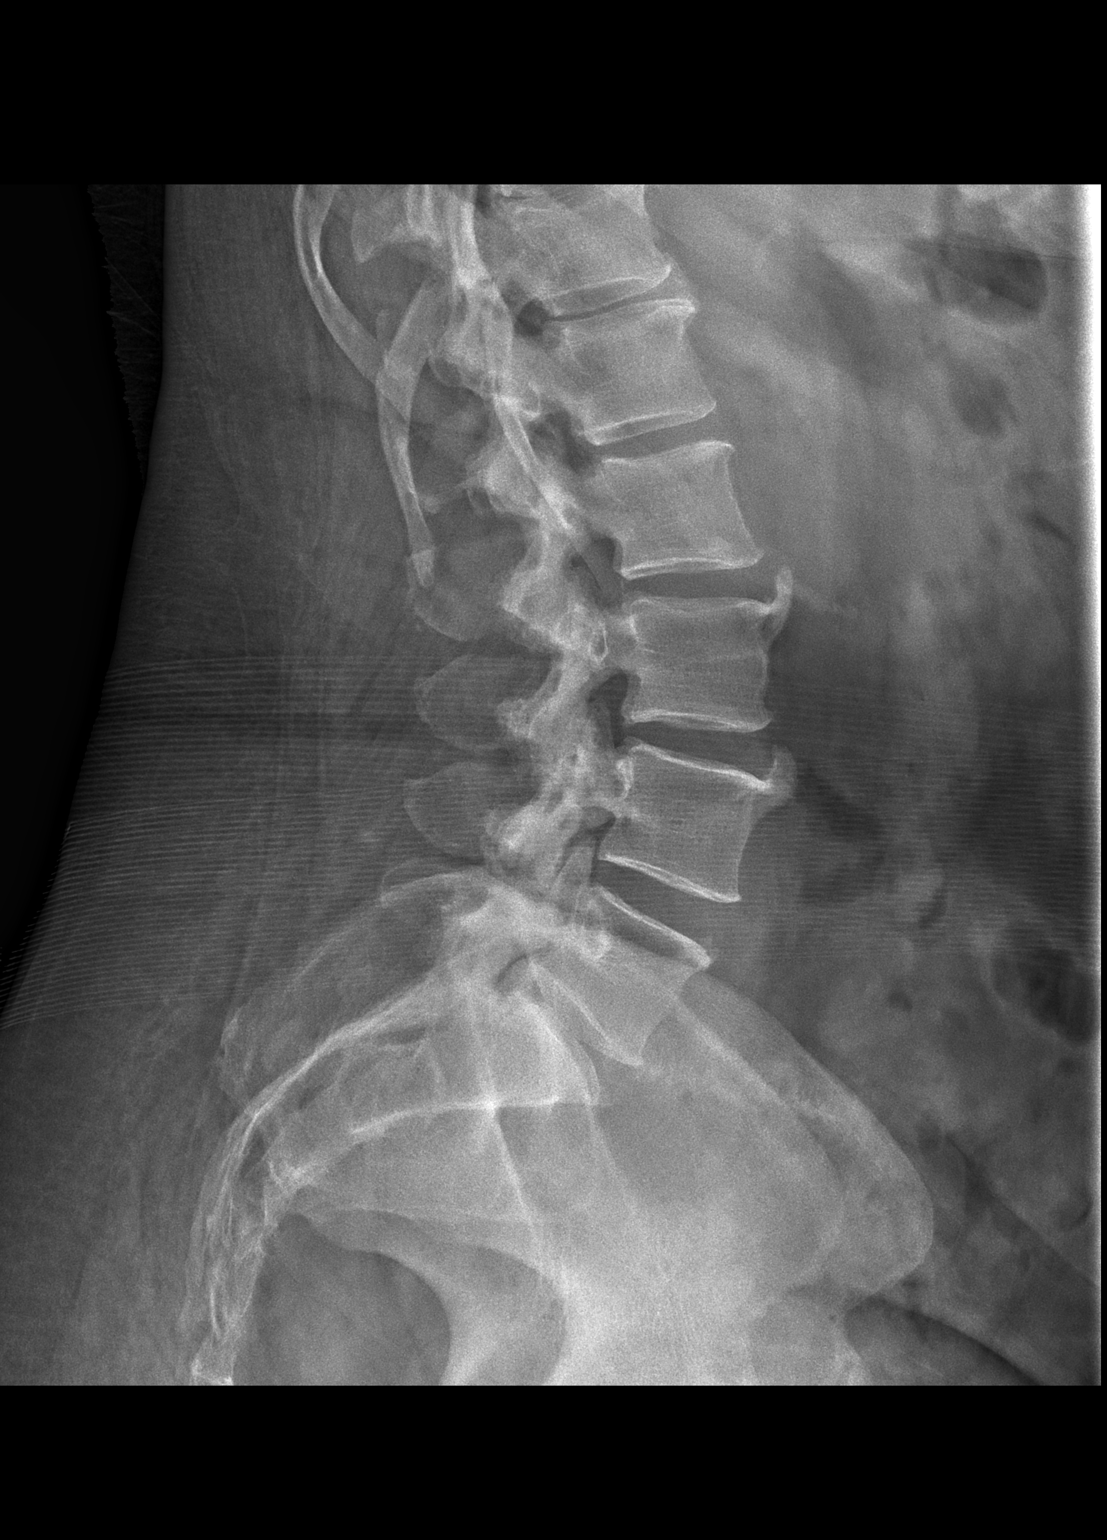

[2 of 2 positions shown; findings below may reference images not displayed]

FINDINGS: Lumbar vertebral heights are intact. There is no listhesis. Disc heights are 
preserved. There are facet degenerative changes, most pronounced at the lower 2 
levels. Mild SI joint degenerative change. No evidence for bone erosion or 
fracture.
IMPRESSION: Lumbar degenerative changes are most pronounced in the lower facet joints. No 
significant loss of disc height. No listhesis or scoliosis. MRI would be useful 
if indicated.

## 2020-08-11 IMAGING — DX HIP 2 VIEWS RIGHT WITH PELVIS
2 series · 3 of 3 positions shown · non-contrast
Comparison: None

HIP 2 VIEWS RIGHT WITH PELVIS, 08/11/2020 [DATE]: 
CLINICAL INDICATION:  History rheumatoid arthritis and lupus

[Series 1: AP · 0.14mm/px · 2 of 2 slices shown (1 of 2)]
[im 1/2]
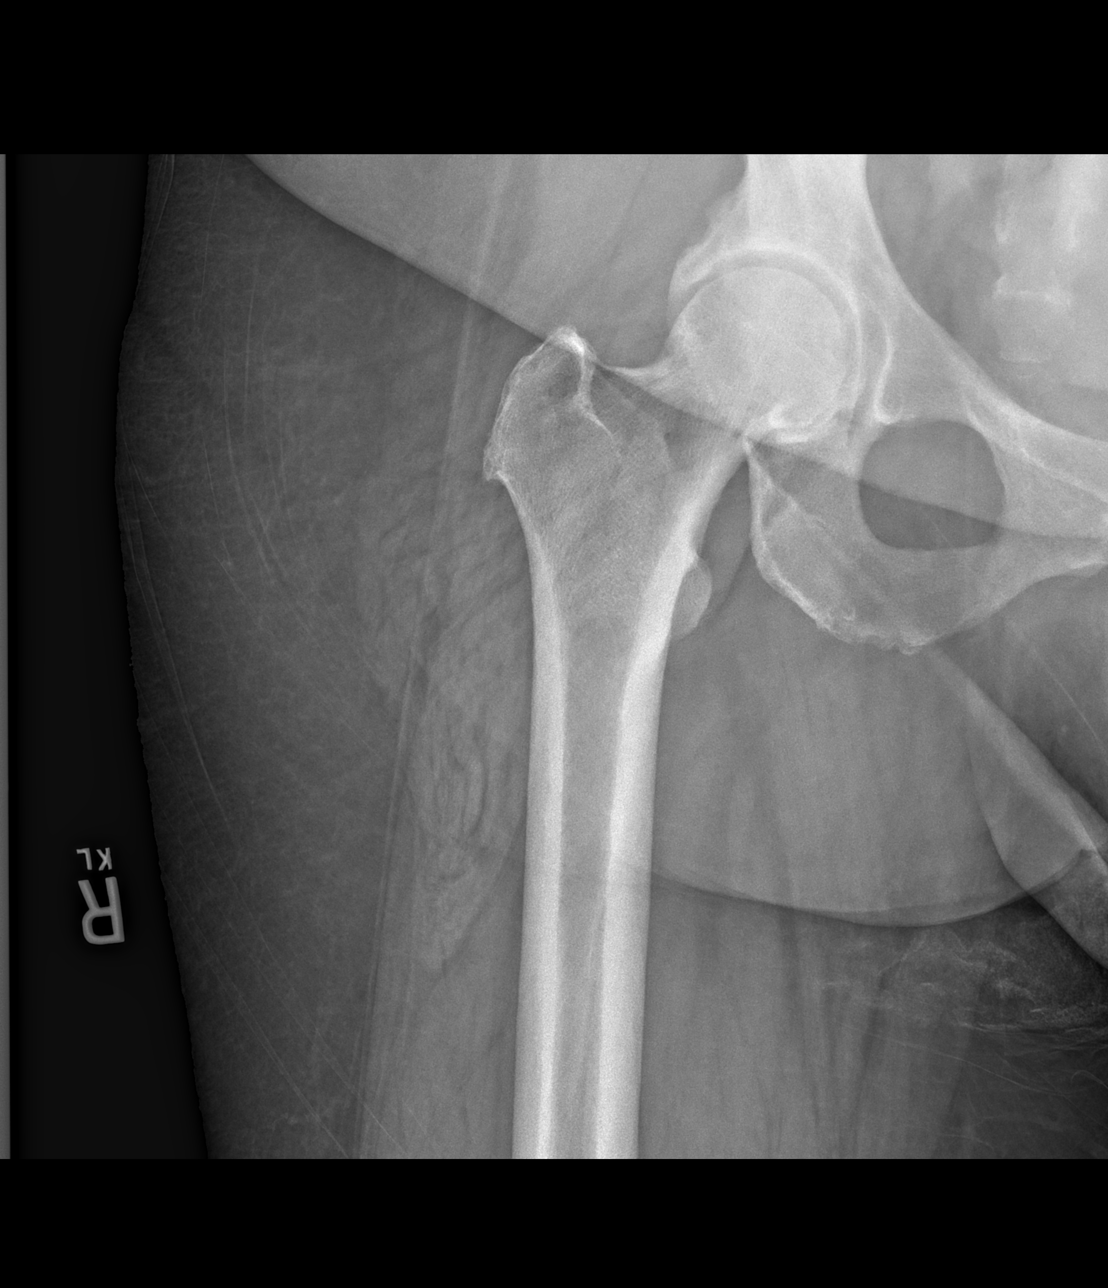
[im 2/2]
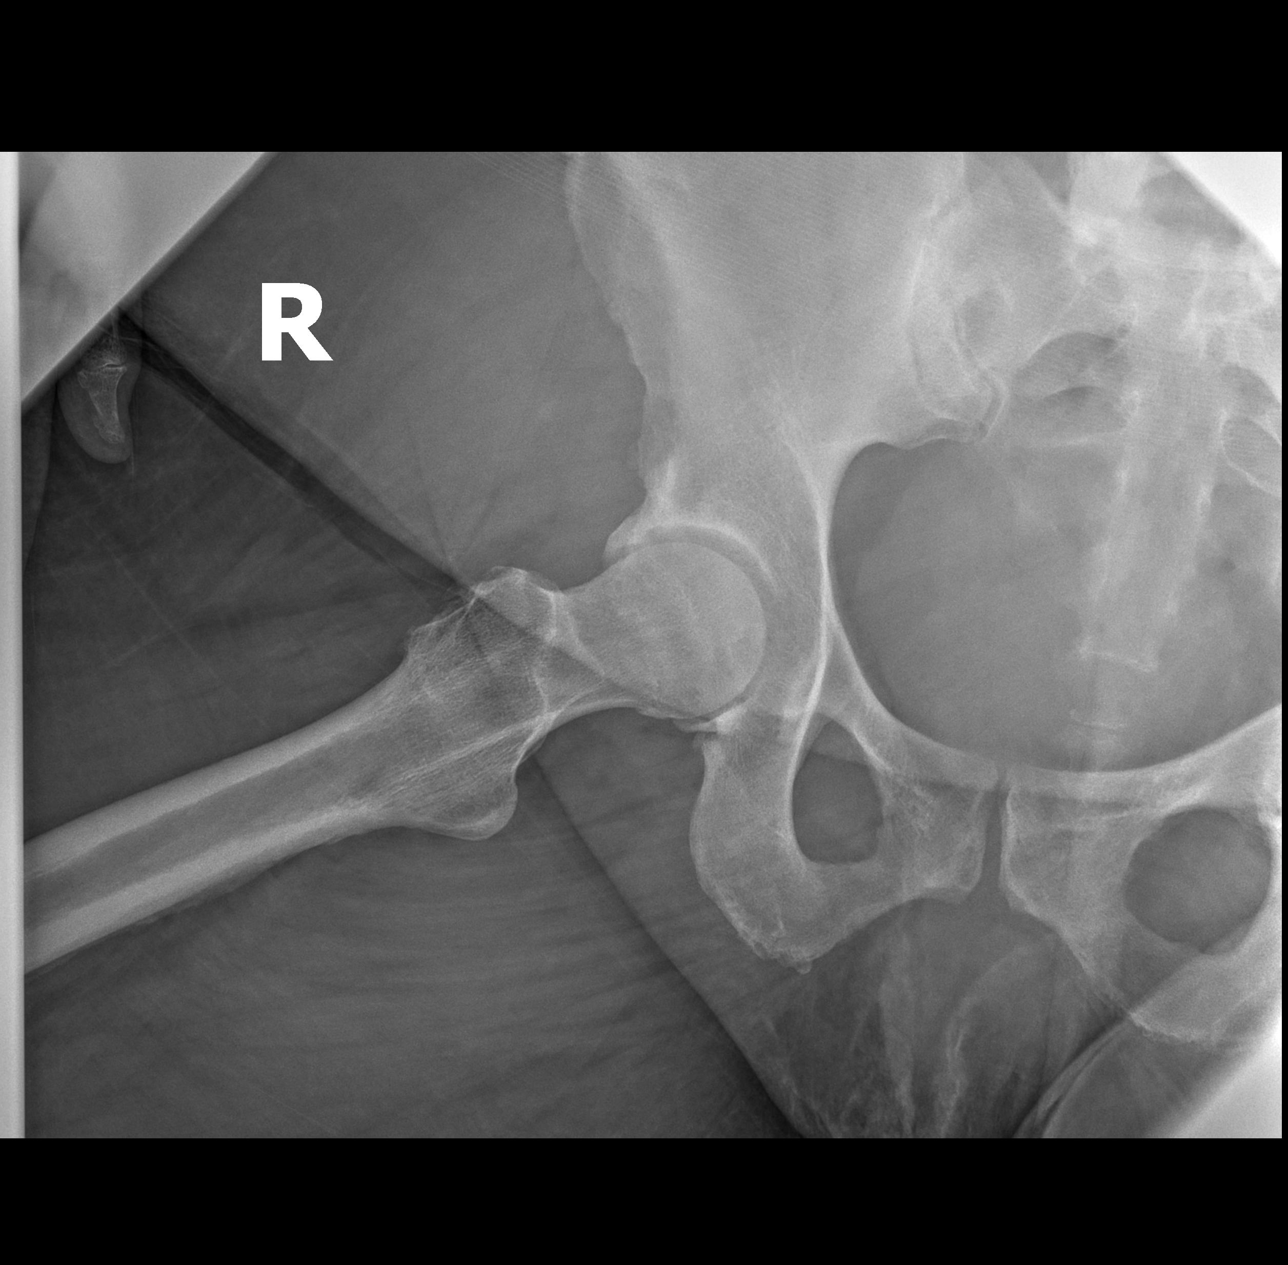

[AP (2 of 2)]
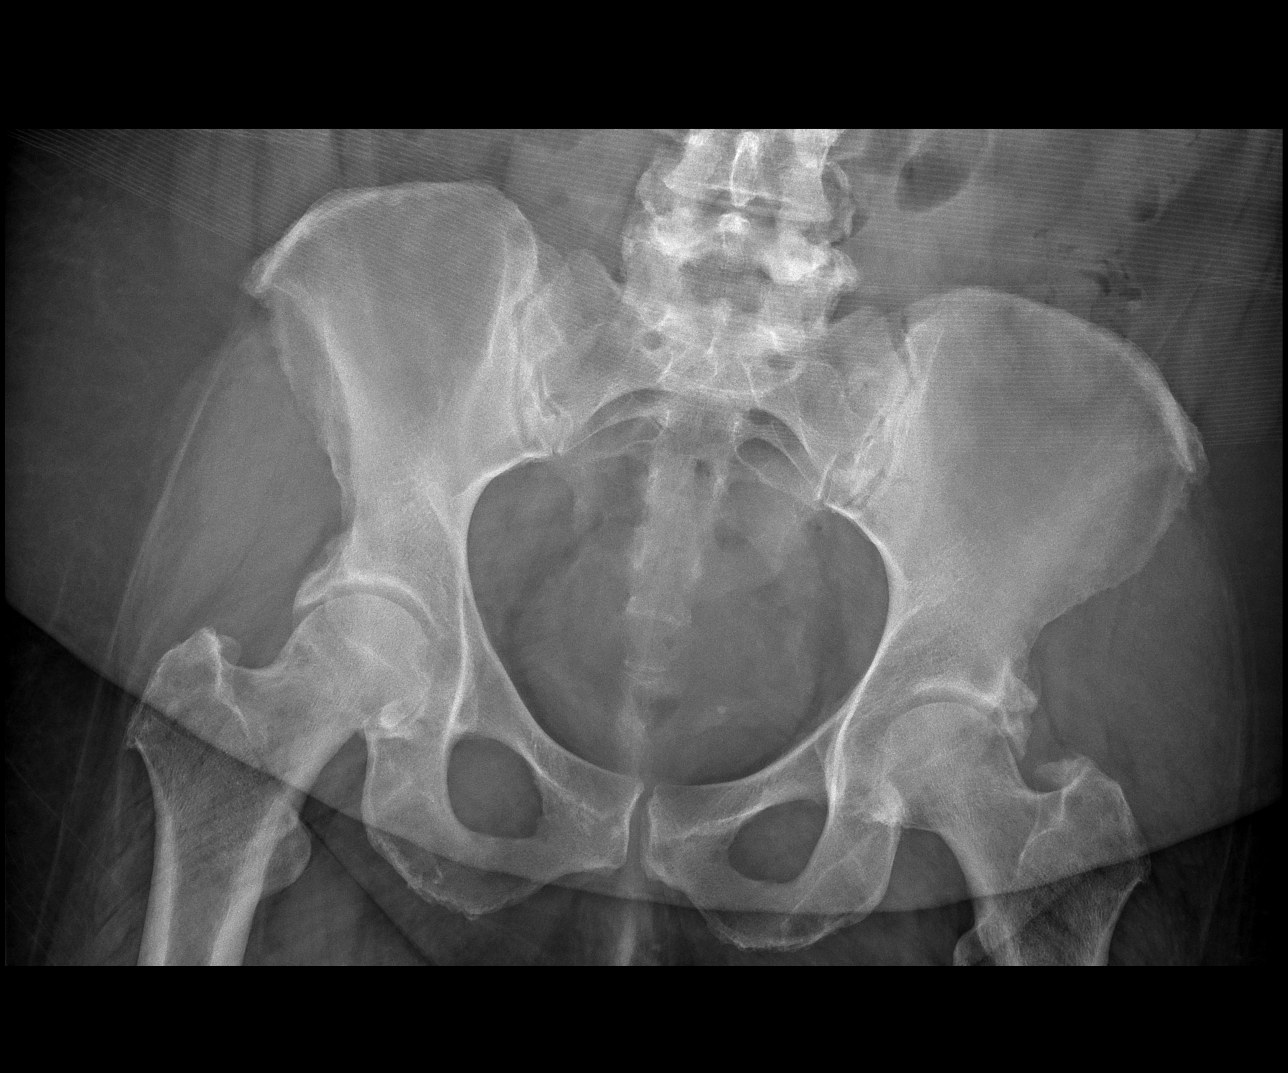

[3 of 3 positions shown; findings below may reference images not displayed]

FINDINGS: There are mild hip degenerative changes including acetabular 
osteophyte. Cartilage heights are preserved. There is no fracture, dislocation 
or osteonecrosis. There is no bone erosion.
IMPRESSION: Mild right hip degenerative change..

## 2021-11-26 IMAGING — DX HAND LEFT 2 VIEWS
1 series · 2 of 2 positions shown · non-contrast
Comparison: None.

________________________________________________________________________________________________ 
HAND LEFT 2 VIEWS, 11/26/2021 [DATE]: 
CLINICAL INDICATION: Fall occurred twice 2 days ago onto left arm and then again 
the left arm stretched out. Pain. History of left wrist fracture years ago.

[Series 1: lateral · 0.14mm/px · 2 of 2 slices shown]
[im 1/2]
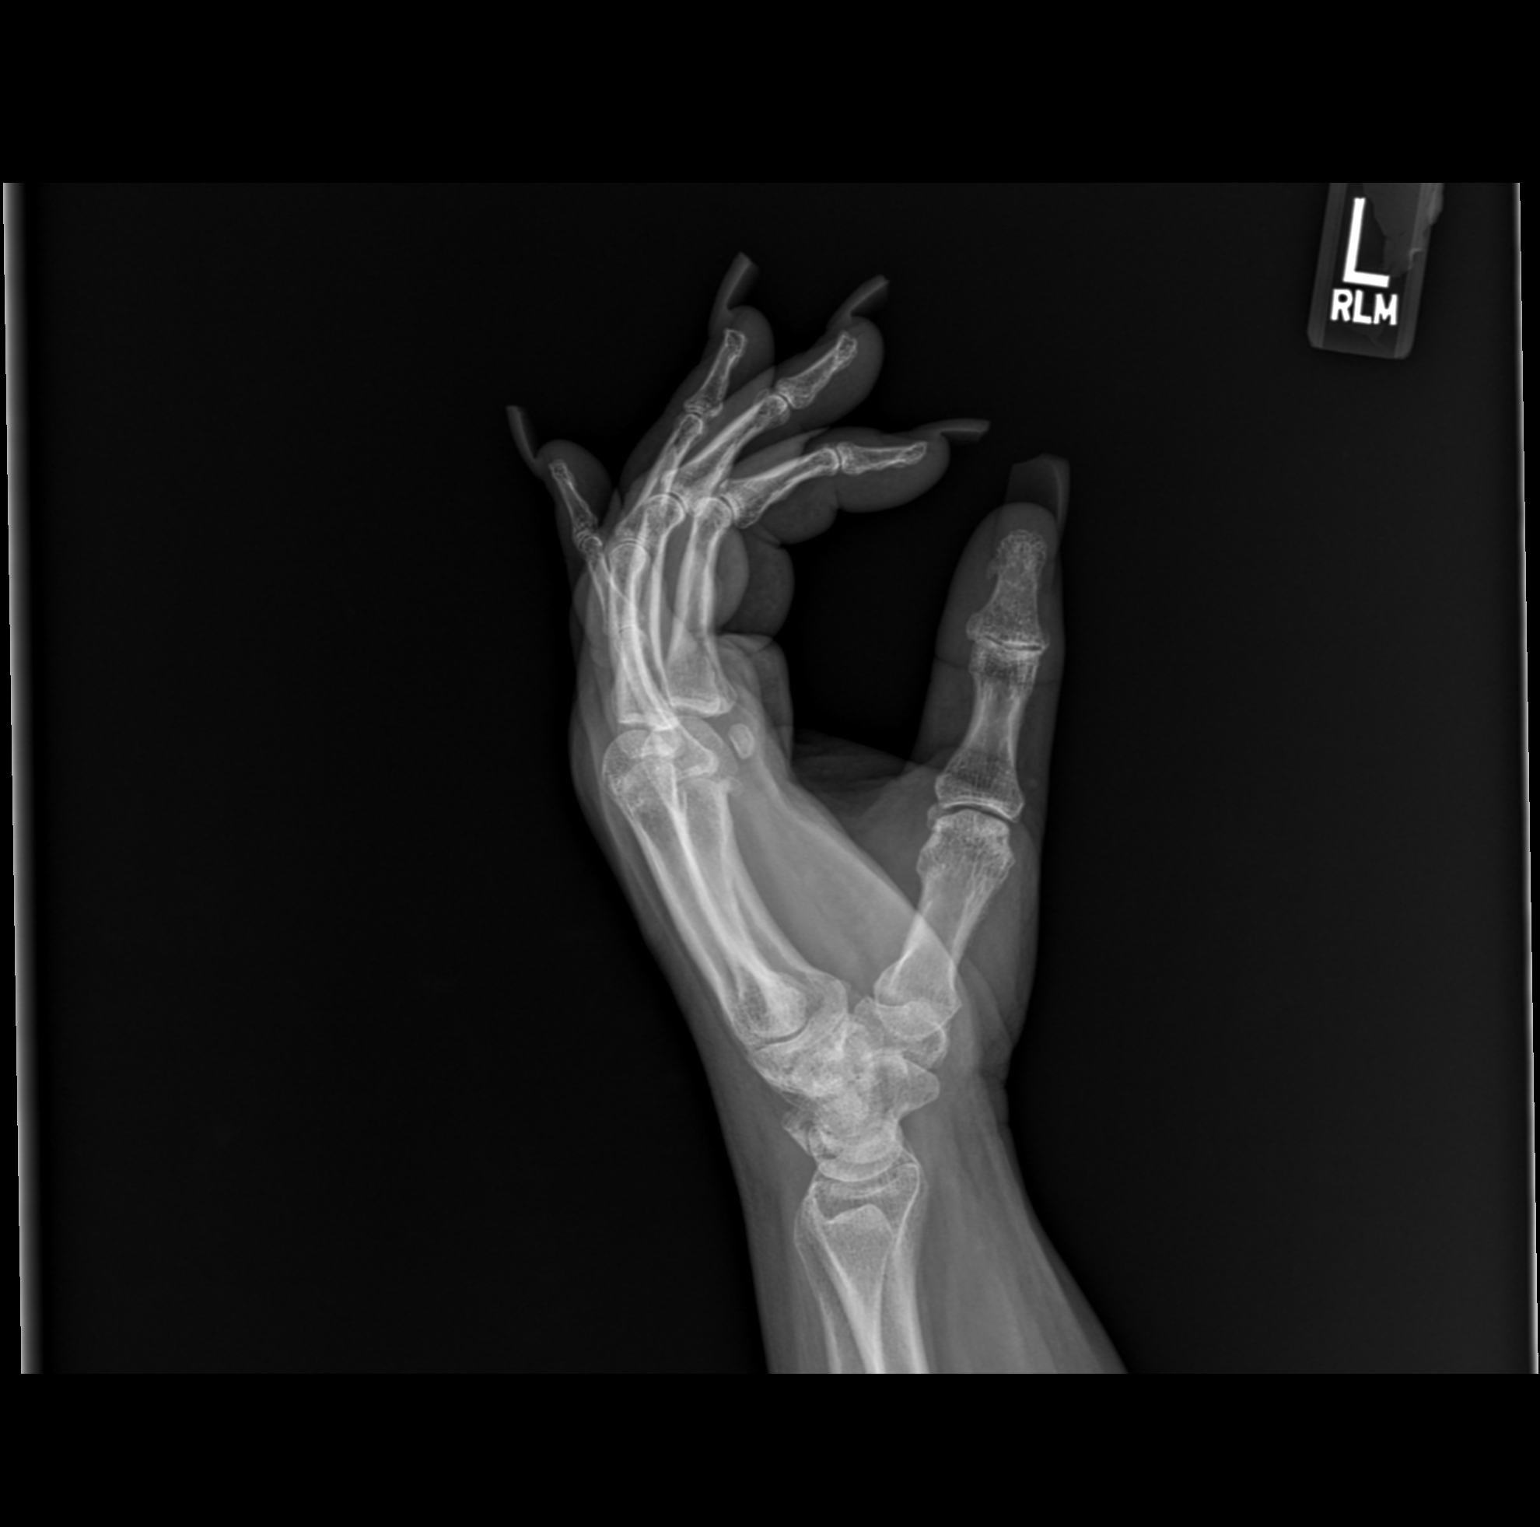
[im 2/2]
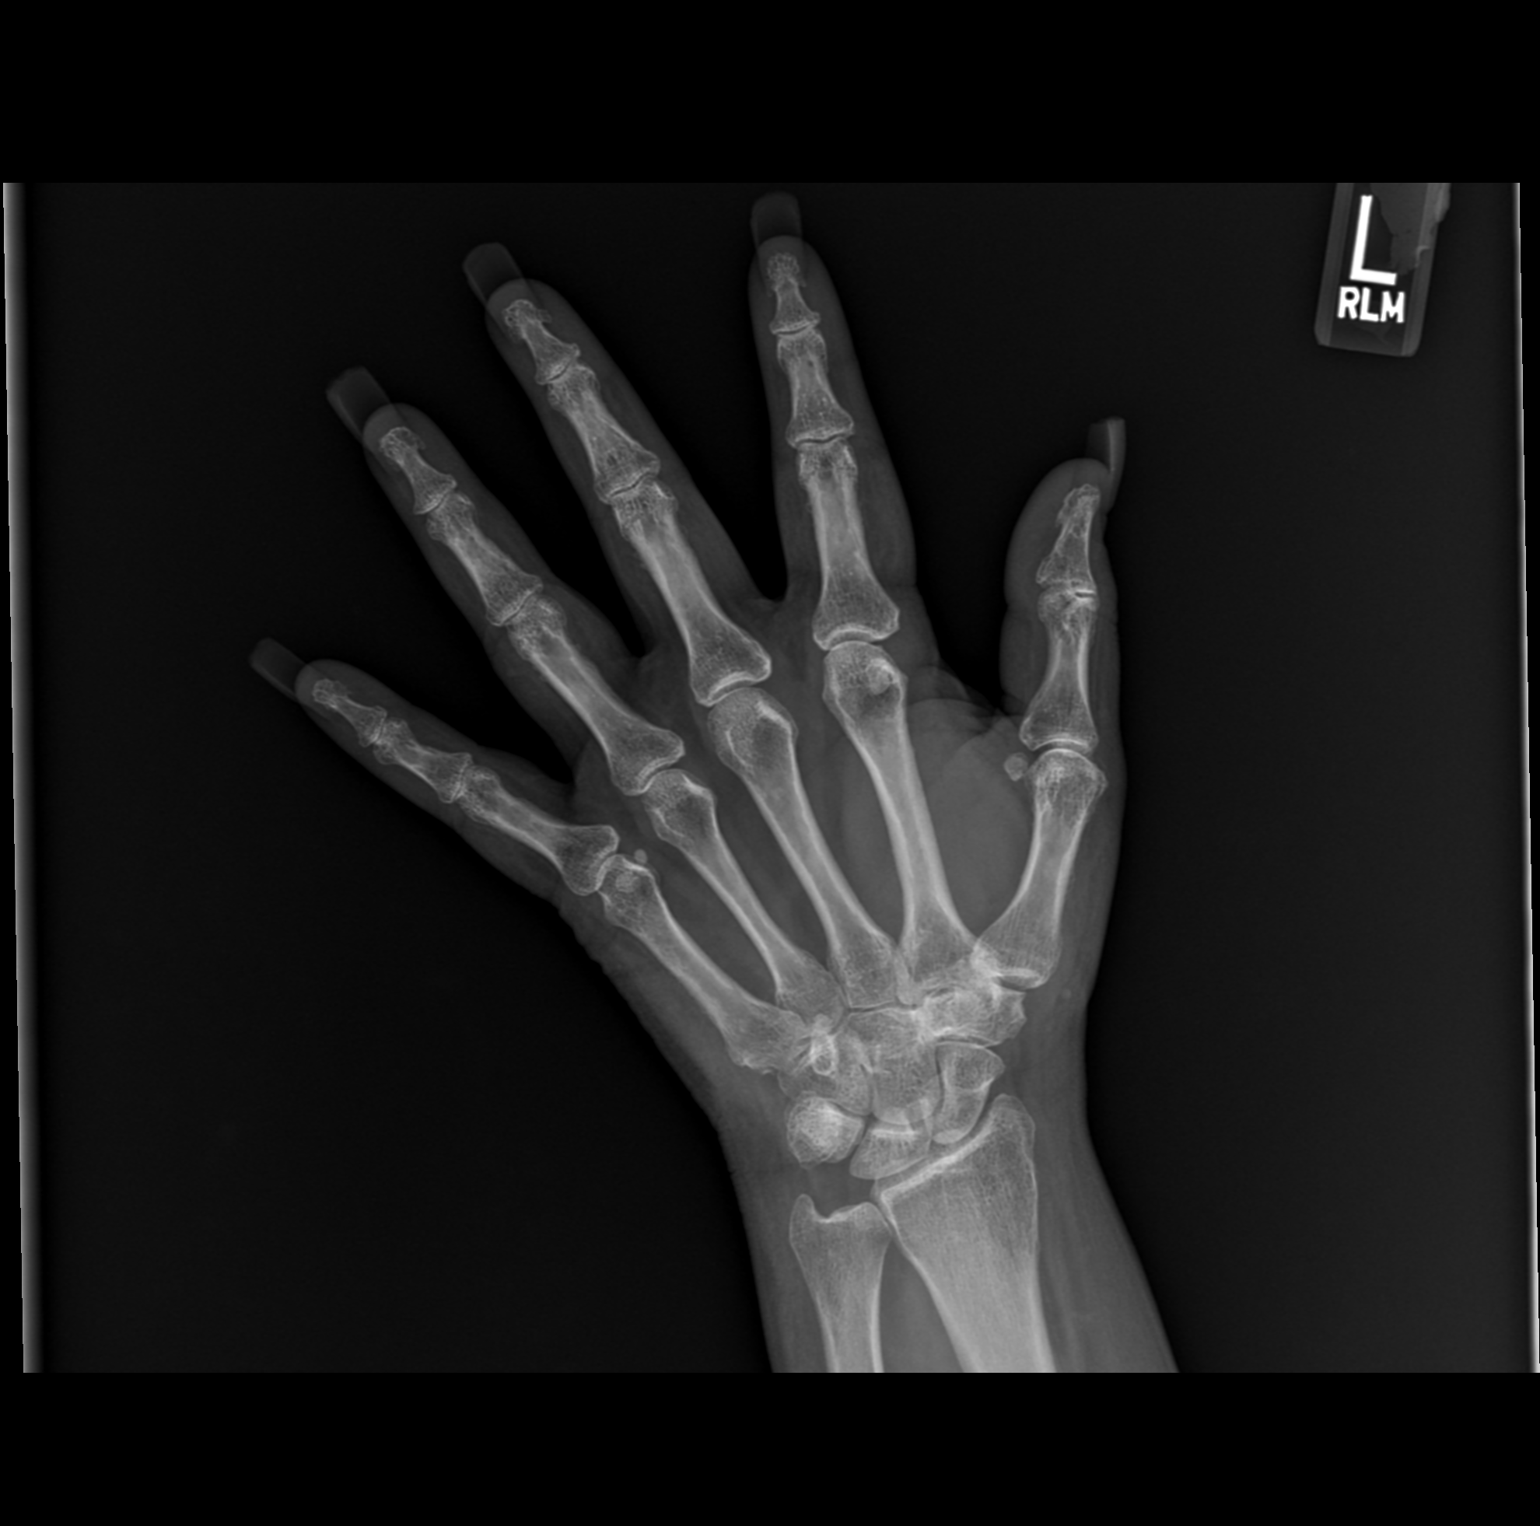

[2 of 2 positions shown; findings below may reference images not displayed]

FINDINGS: No fractures or dislocations. Joint spaces are preserved. Normal bone 
density. No soft tissue swelling.
IMPRESSION: Negative exam.

## 2021-11-26 IMAGING — DX FOREARM LEFT 2 VIEWS
1 series · 2 of 2 positions shown · non-contrast
Comparison: None.

________________________________________________________________________________________________ 
FOREARM LEFT 2 VIEWS, 11/26/2021 [DATE]: 
CLINICAL INDICATION: Fall occurred twice 2 days ago onto left arm and then again 
the left arm stretched out. Pain. History of left wrist fracture years ago.

[Series 1: AP · 0.14mm/px · 2 of 2 slices shown]
[im 1/2]
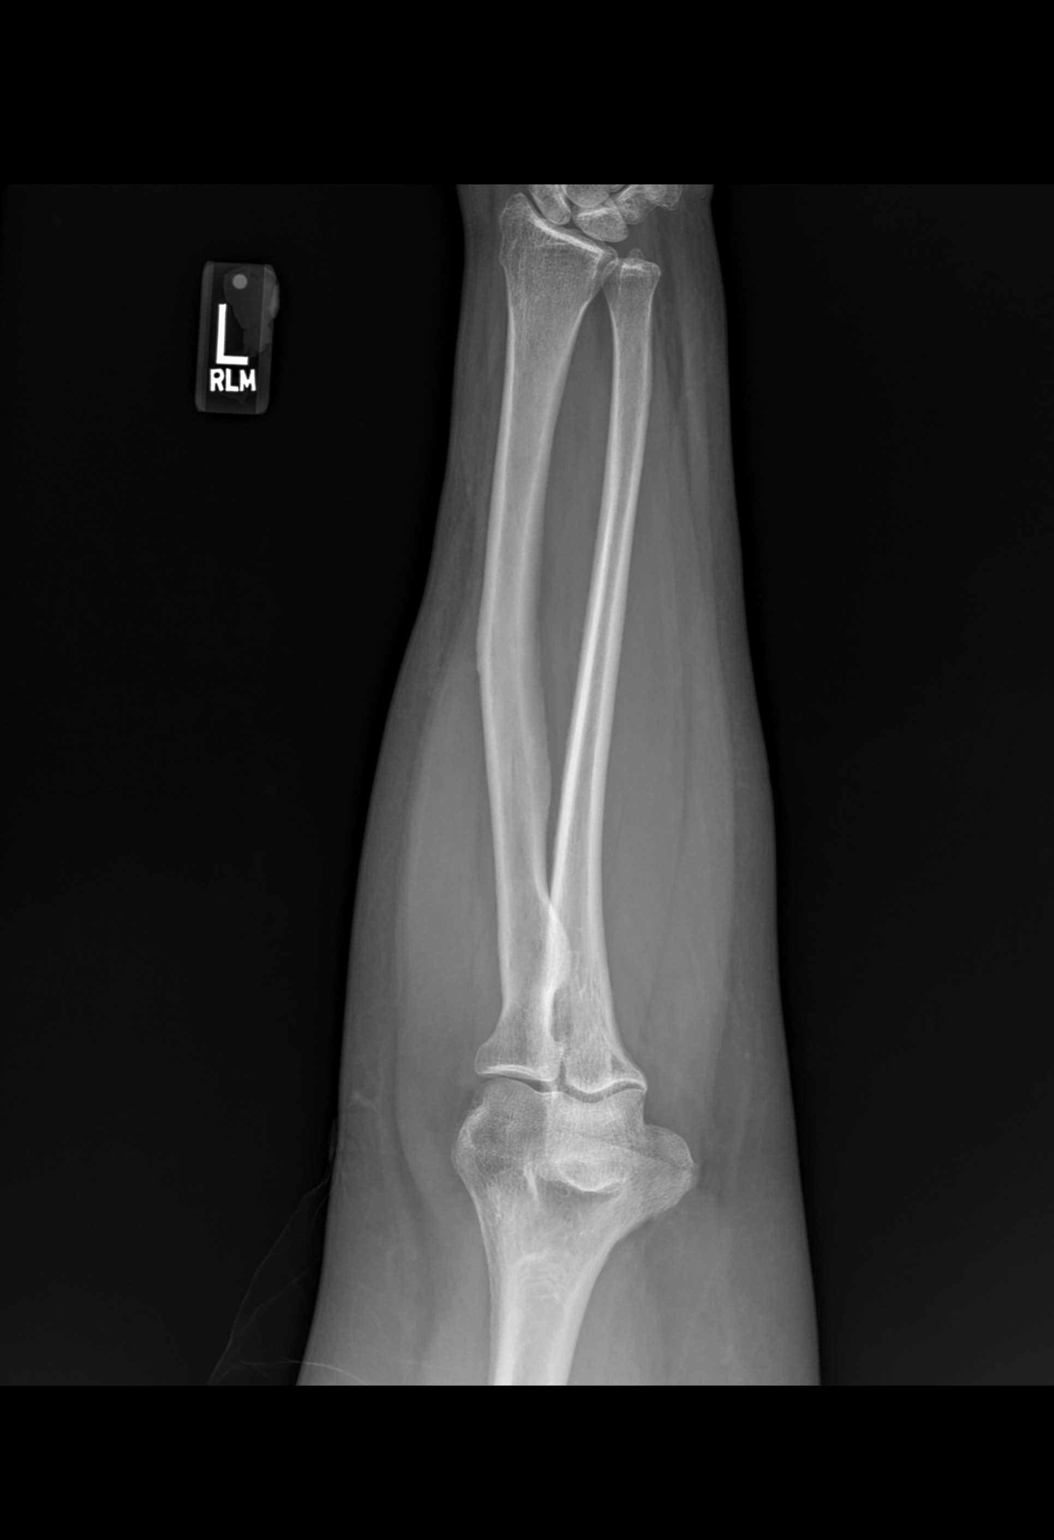
[im 2/2]
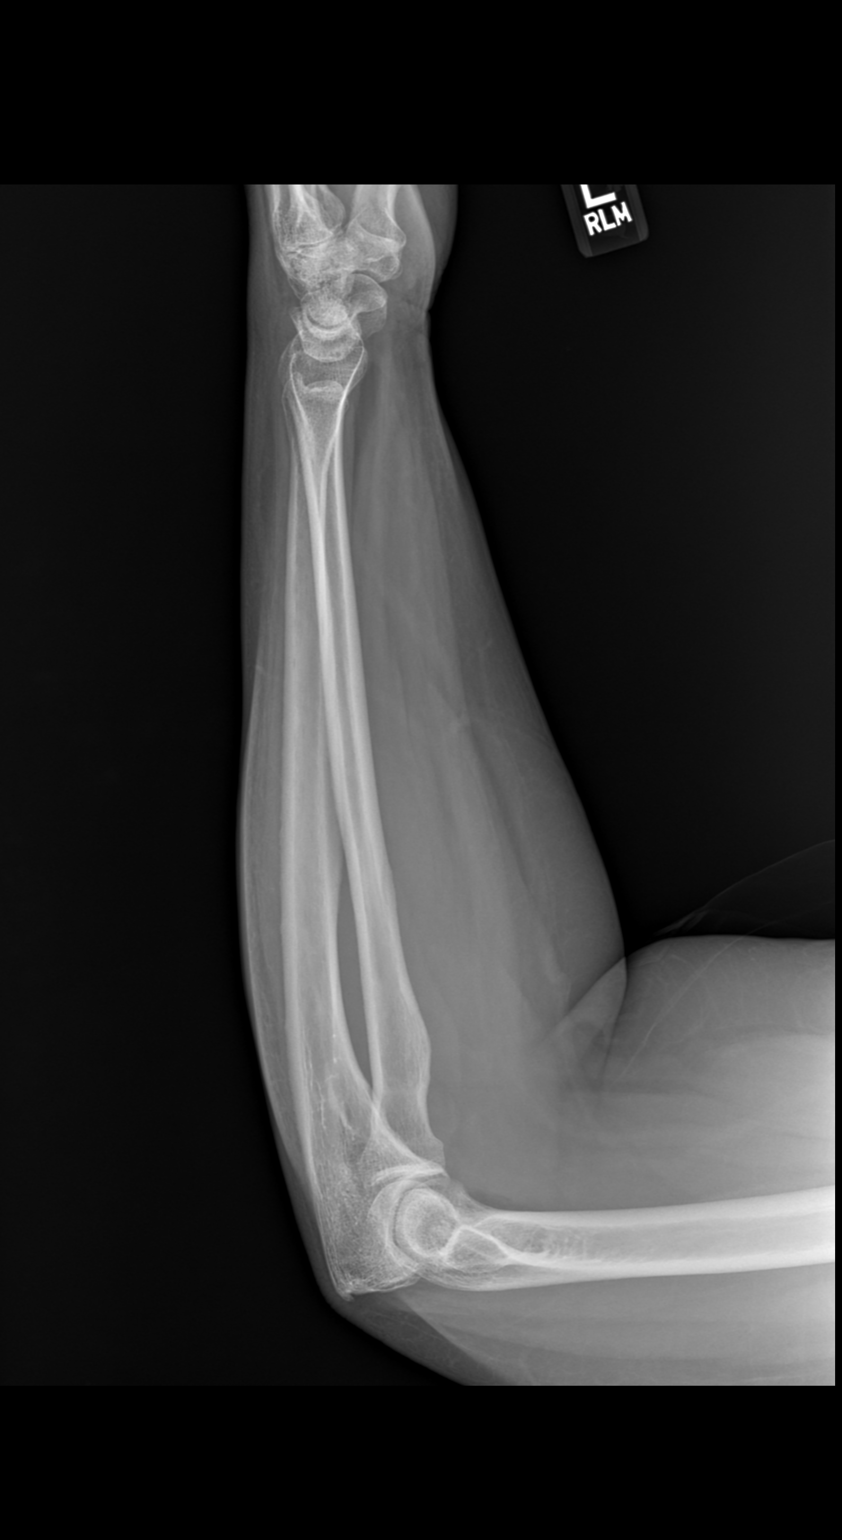

[2 of 2 positions shown; findings below may reference images not displayed]

FINDINGS: There is trace cortical irregularity of the radial head. Normal 
alignment. Joint spaces are preserved. No prominent elbow joint effusion.
IMPRESSION: Trace cortical irregularity of the radial head, subtle nondisplaced radial head 
fracture cannot be excluded, consideration could be made for MR exam of the 
elbow.

## 2021-11-26 IMAGING — DX HUMERUS LEFT 2 VIEWS
1 series · 2 of 2 positions shown · non-contrast
Comparison: None.

________________________________________________________________________________________________ 
HUMERUS LEFT 2 VIEWS, 11/26/2021 [DATE]: 
CLINICAL INDICATION: Fall occurred twice 2 days ago onto left arm and then again 
the left arm stretched out. Pain. History of left wrist fracture years ago.

[Series 1: AP · 0.14mm/px · 2 of 2 slices shown]
[im 1/2]
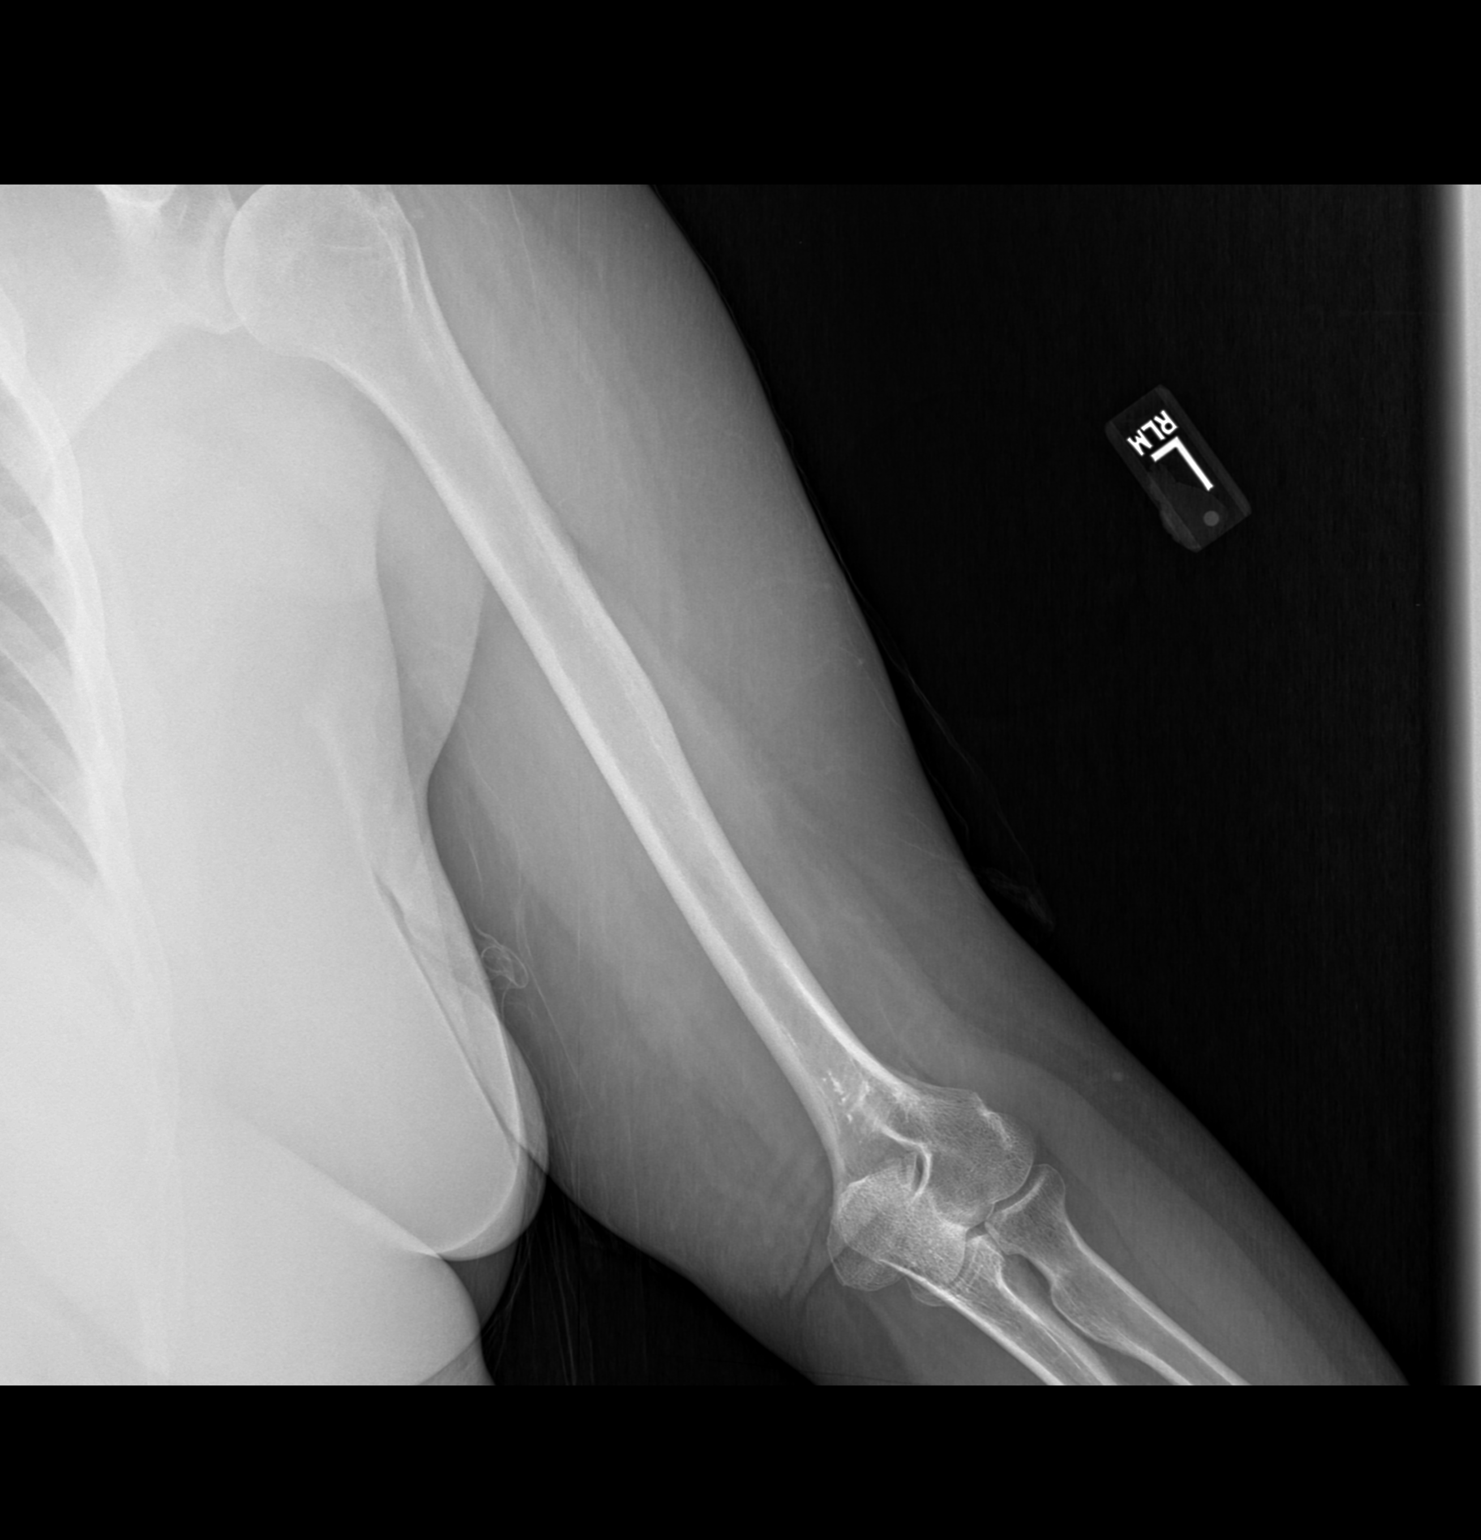
[im 2/2]
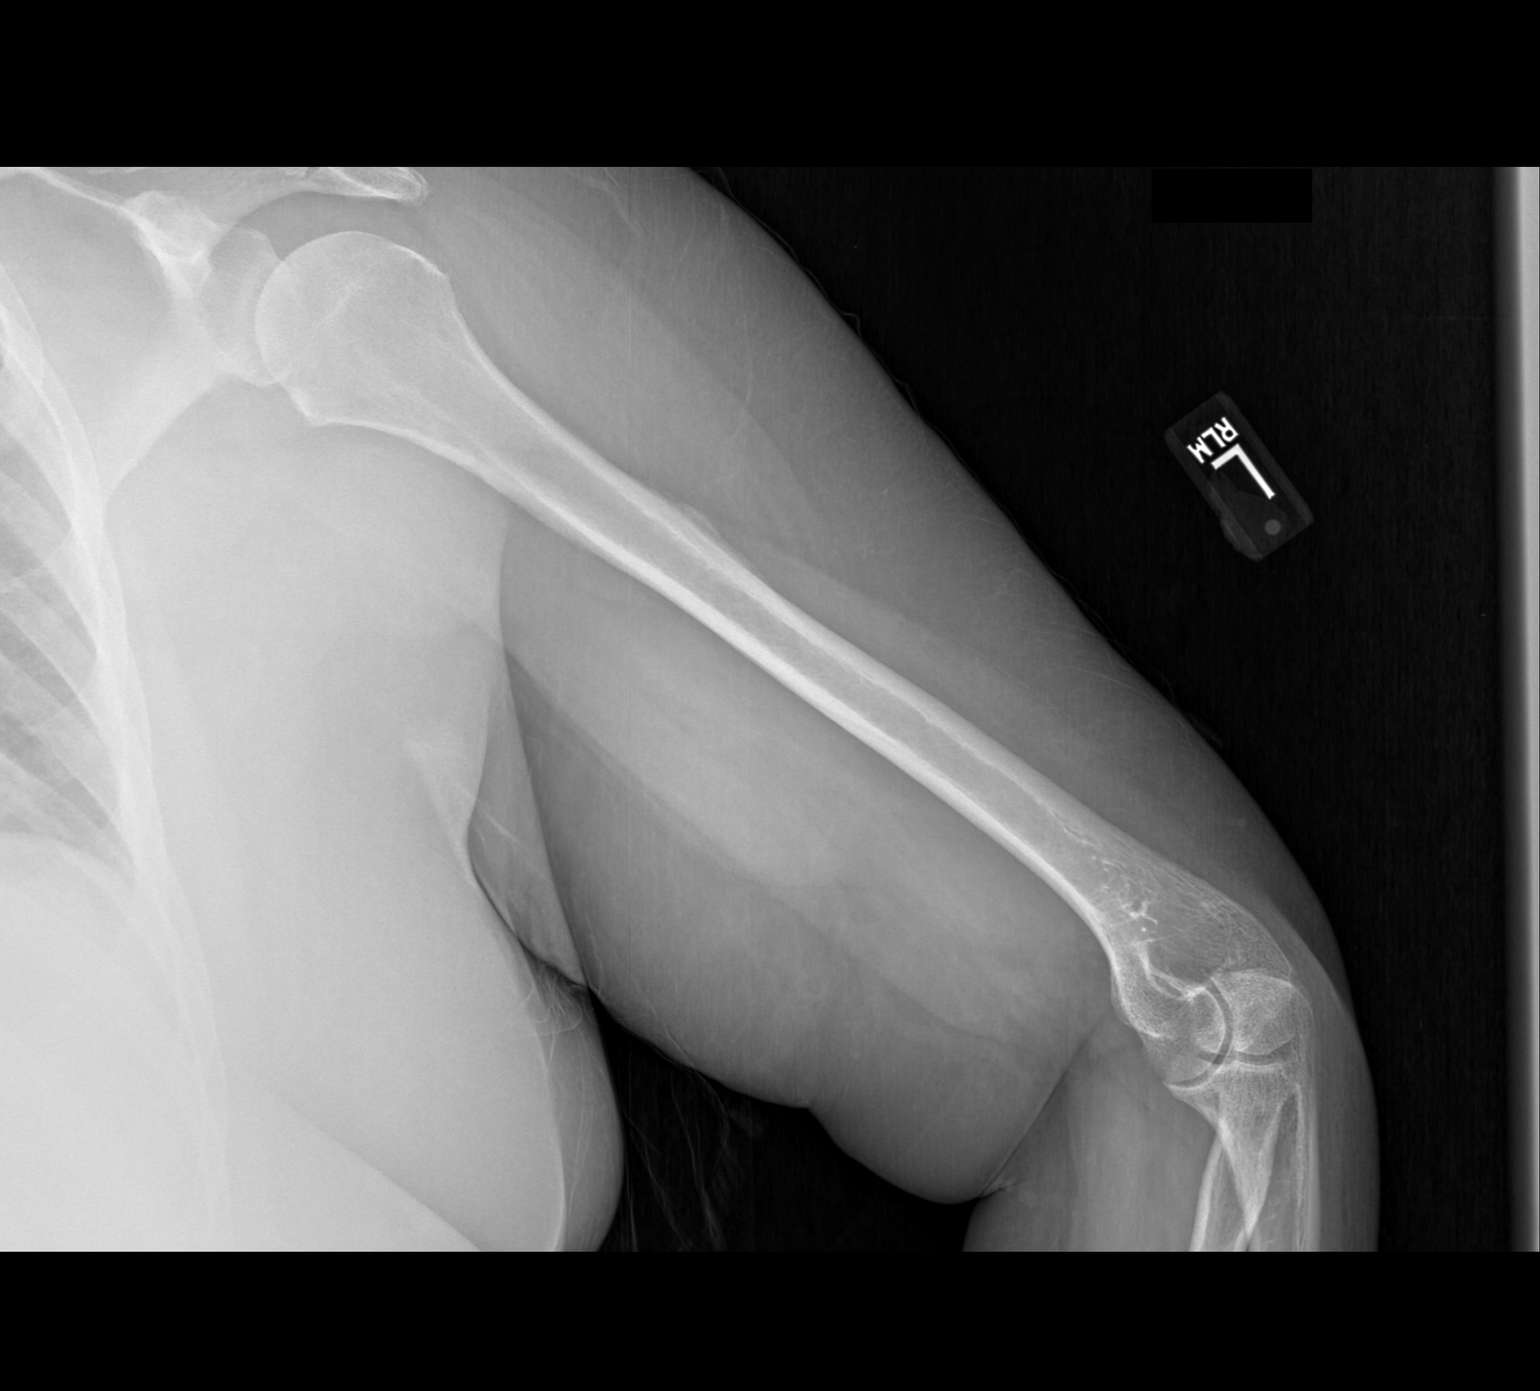

[2 of 2 positions shown; findings below may reference images not displayed]

FINDINGS: No fracture. Normal alignment. Joint spaces are preserved. No focal 
soft tissue swelling.
IMPRESSION: Negative exam.

## 2021-11-26 IMAGING — DX SHOULDER LEFT 2 VIEWS
1 series · 3 of 3 positions shown · non-contrast
Comparison: None.

________________________________________________________________________________________________ 
SHOULDER LEFT 2 VIEWS, 11/26/2021 [DATE]: 
CLINICAL INDICATION: Fall occurred twice 2 days ago onto left arm and then again 
the left arm stretched out. Pain. History of left wrist fracture years ago.

[Series 1: AP · 0.14mm/px · 3 of 3 slices shown]
[im 1/3]
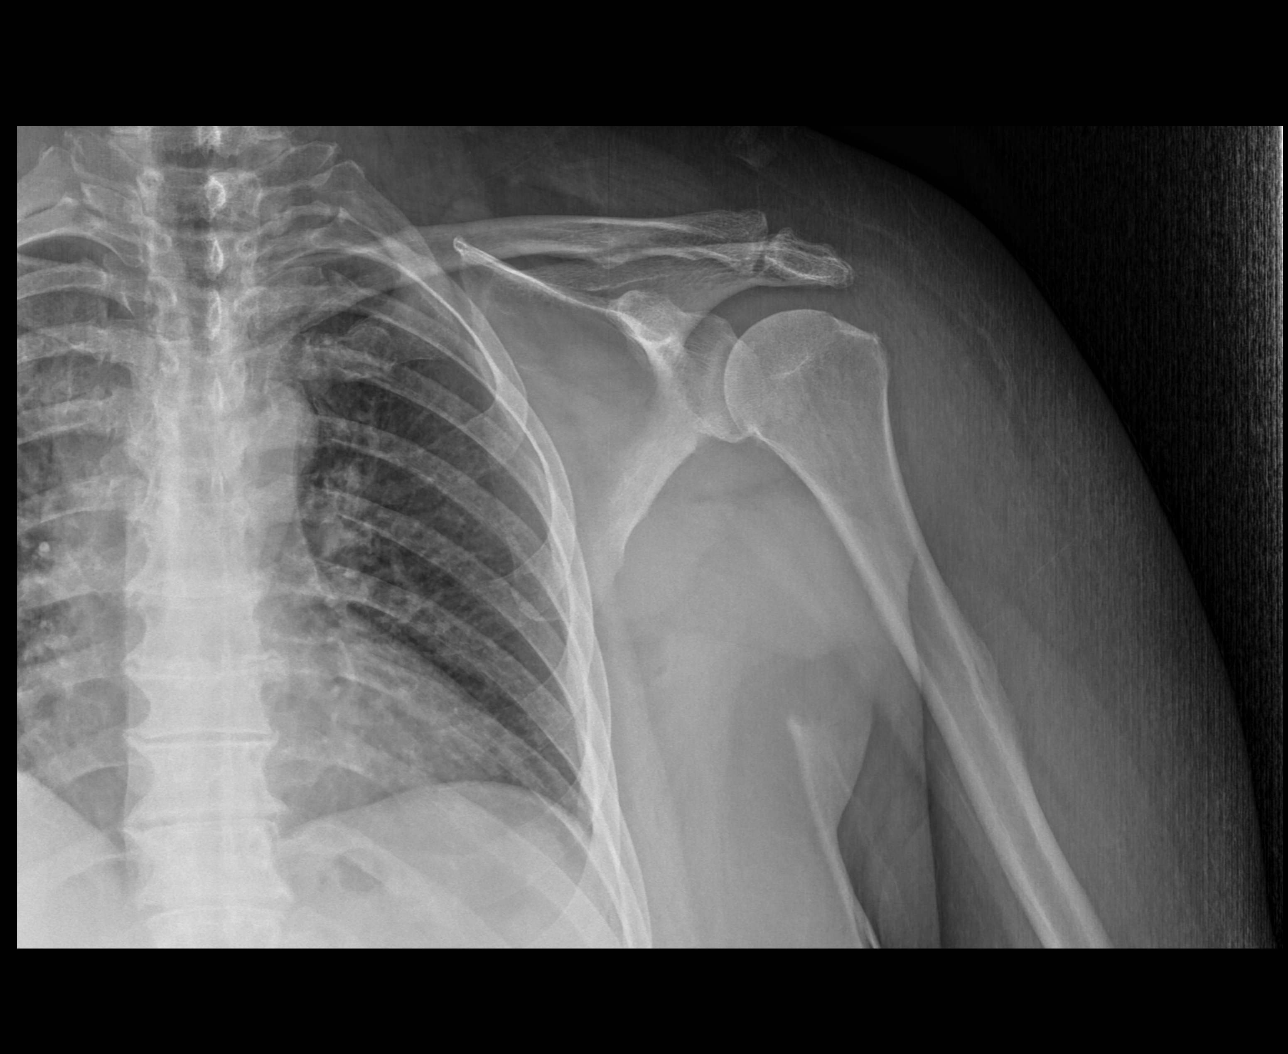
[im 2/3]
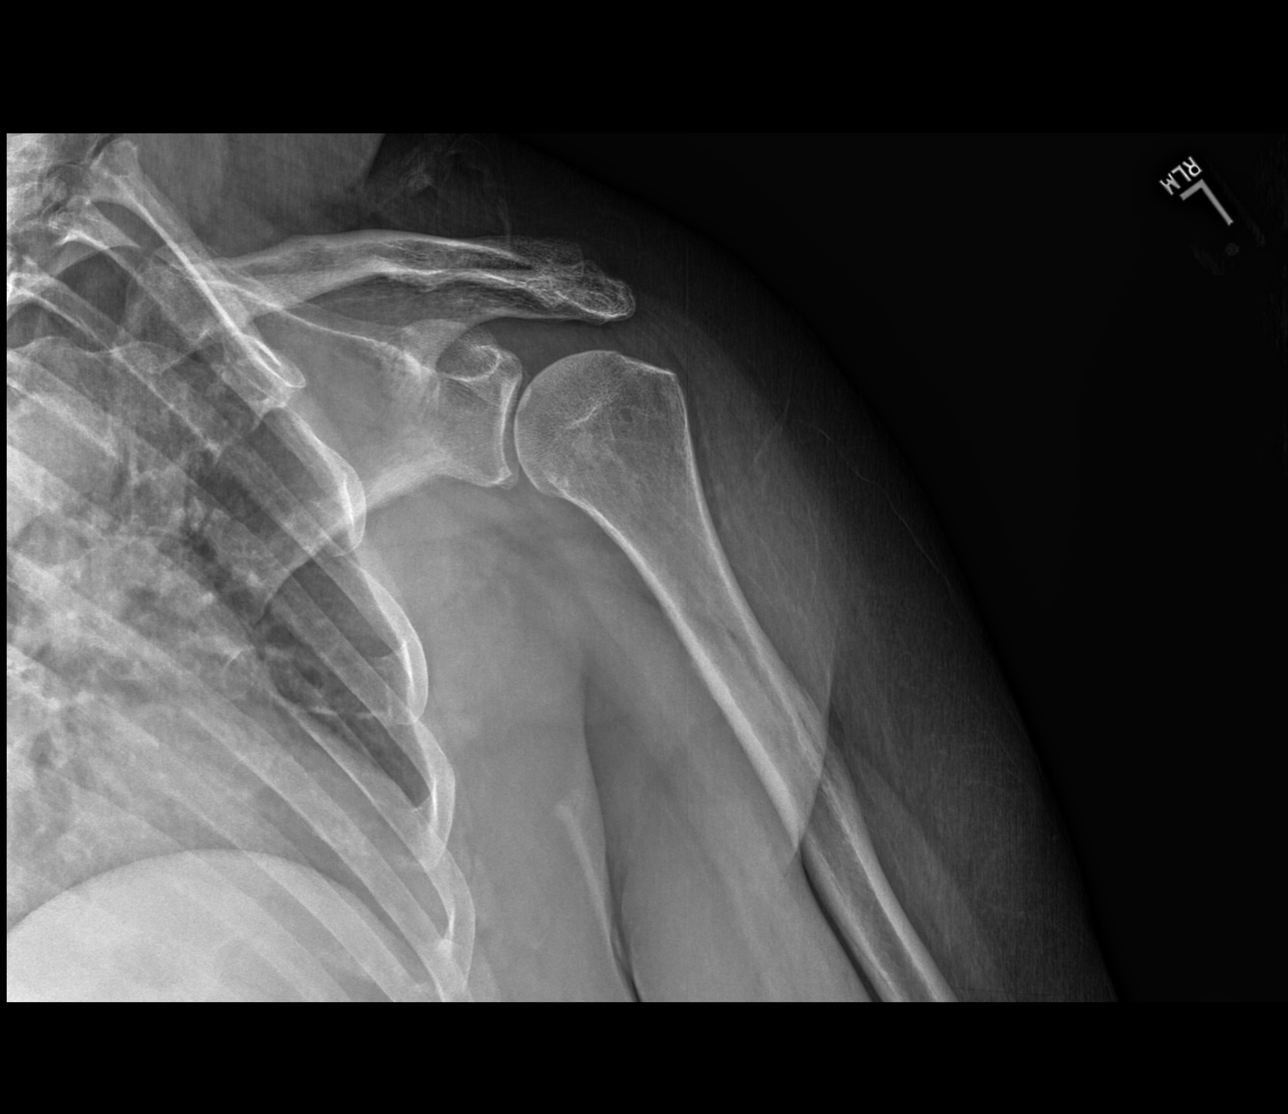
[im 3/3]
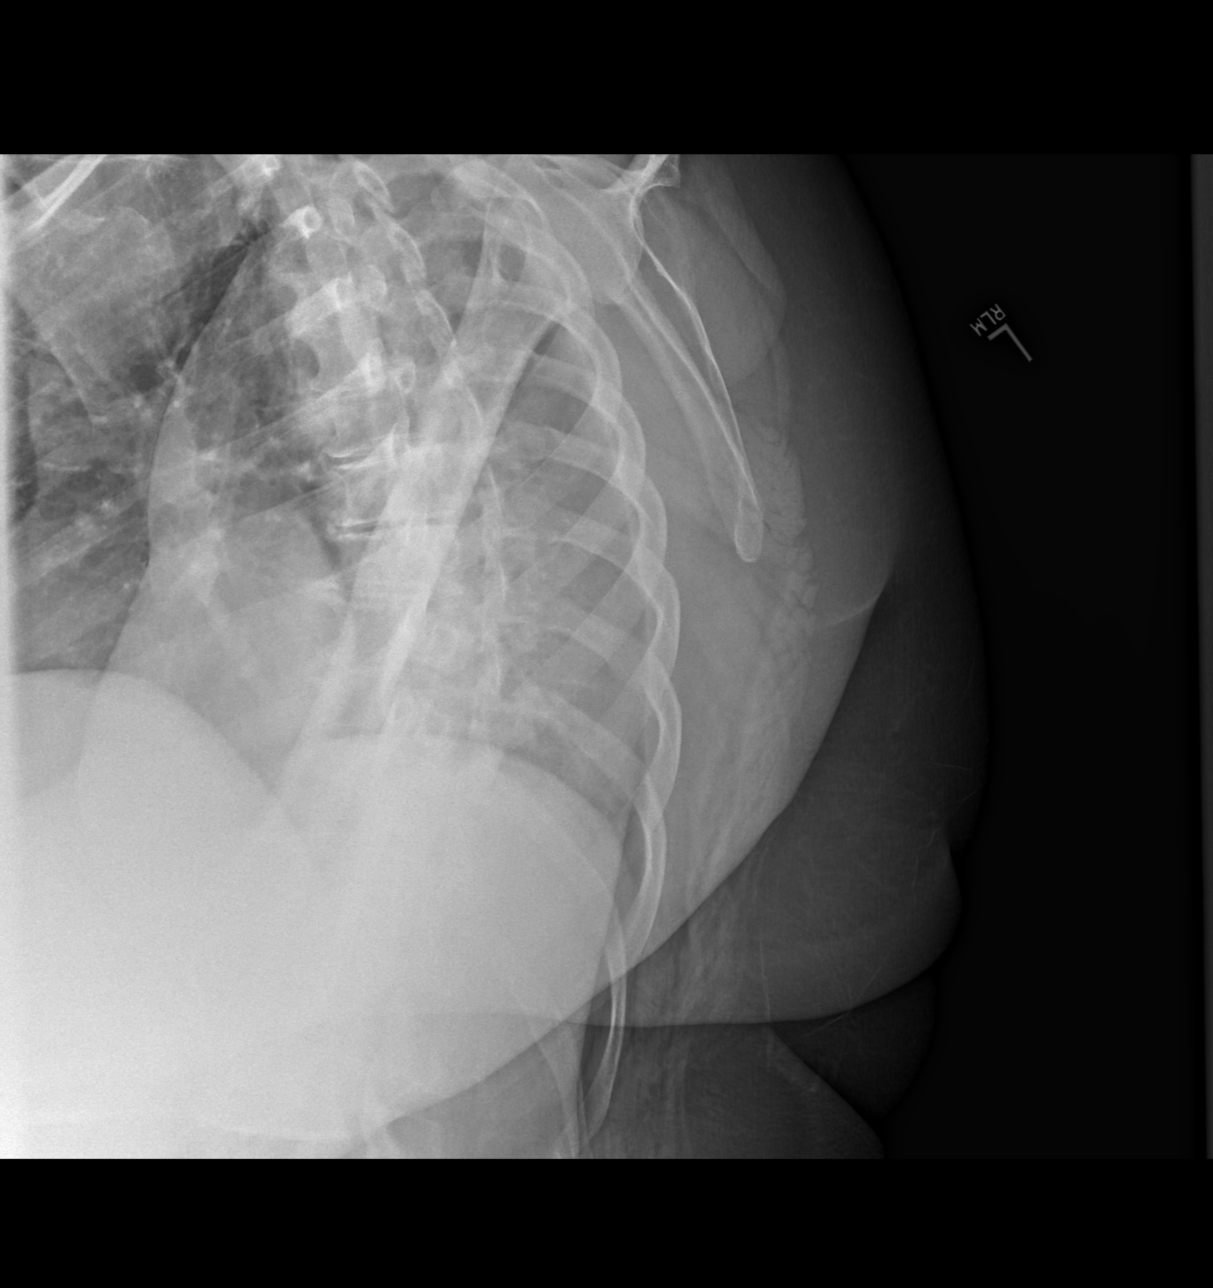

[3 of 3 positions shown; findings below may reference images not displayed]

FINDINGS: No fractures or dislocations. Joint spaces are preserved. Normal bone 
density. No soft tissue swelling.
IMPRESSION: Negative exam.

## 2021-11-26 IMAGING — DX ELBOW 2 VIEWS LEFT
1 series · 2 of 2 positions shown · non-contrast
Comparison: None.

________________________________________________________________________________________________ 
ELBOW 2 VIEWS LEFT, 11/26/2021 [DATE]: 
CLINICAL INDICATION: Fall occurred twice 2 days ago onto left arm and then again 
the left arm stretched out. Pain. History of left wrist fracture years ago.

[Series 1: AP · 0.14mm/px · 2 of 2 slices shown]
[im 1/2]
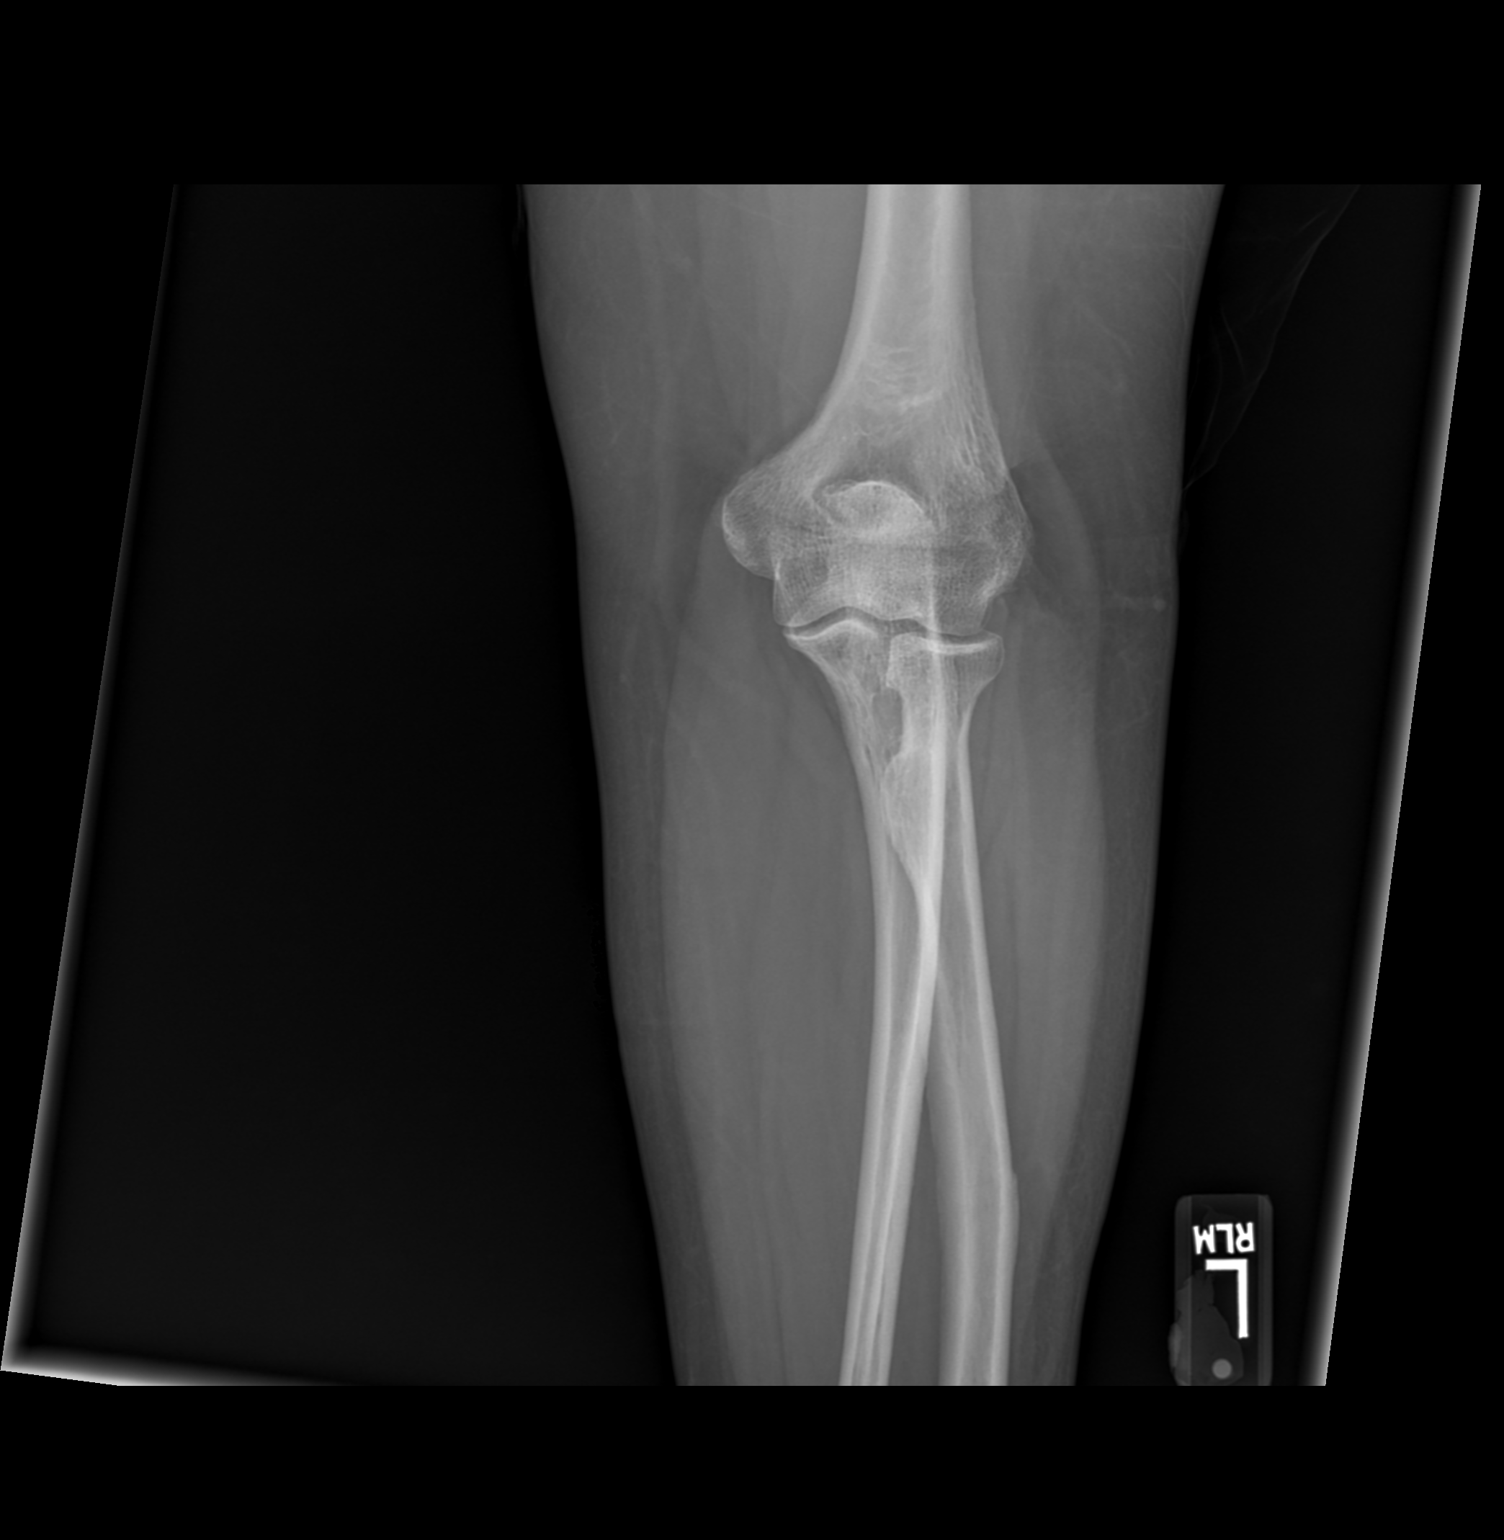
[im 2/2]
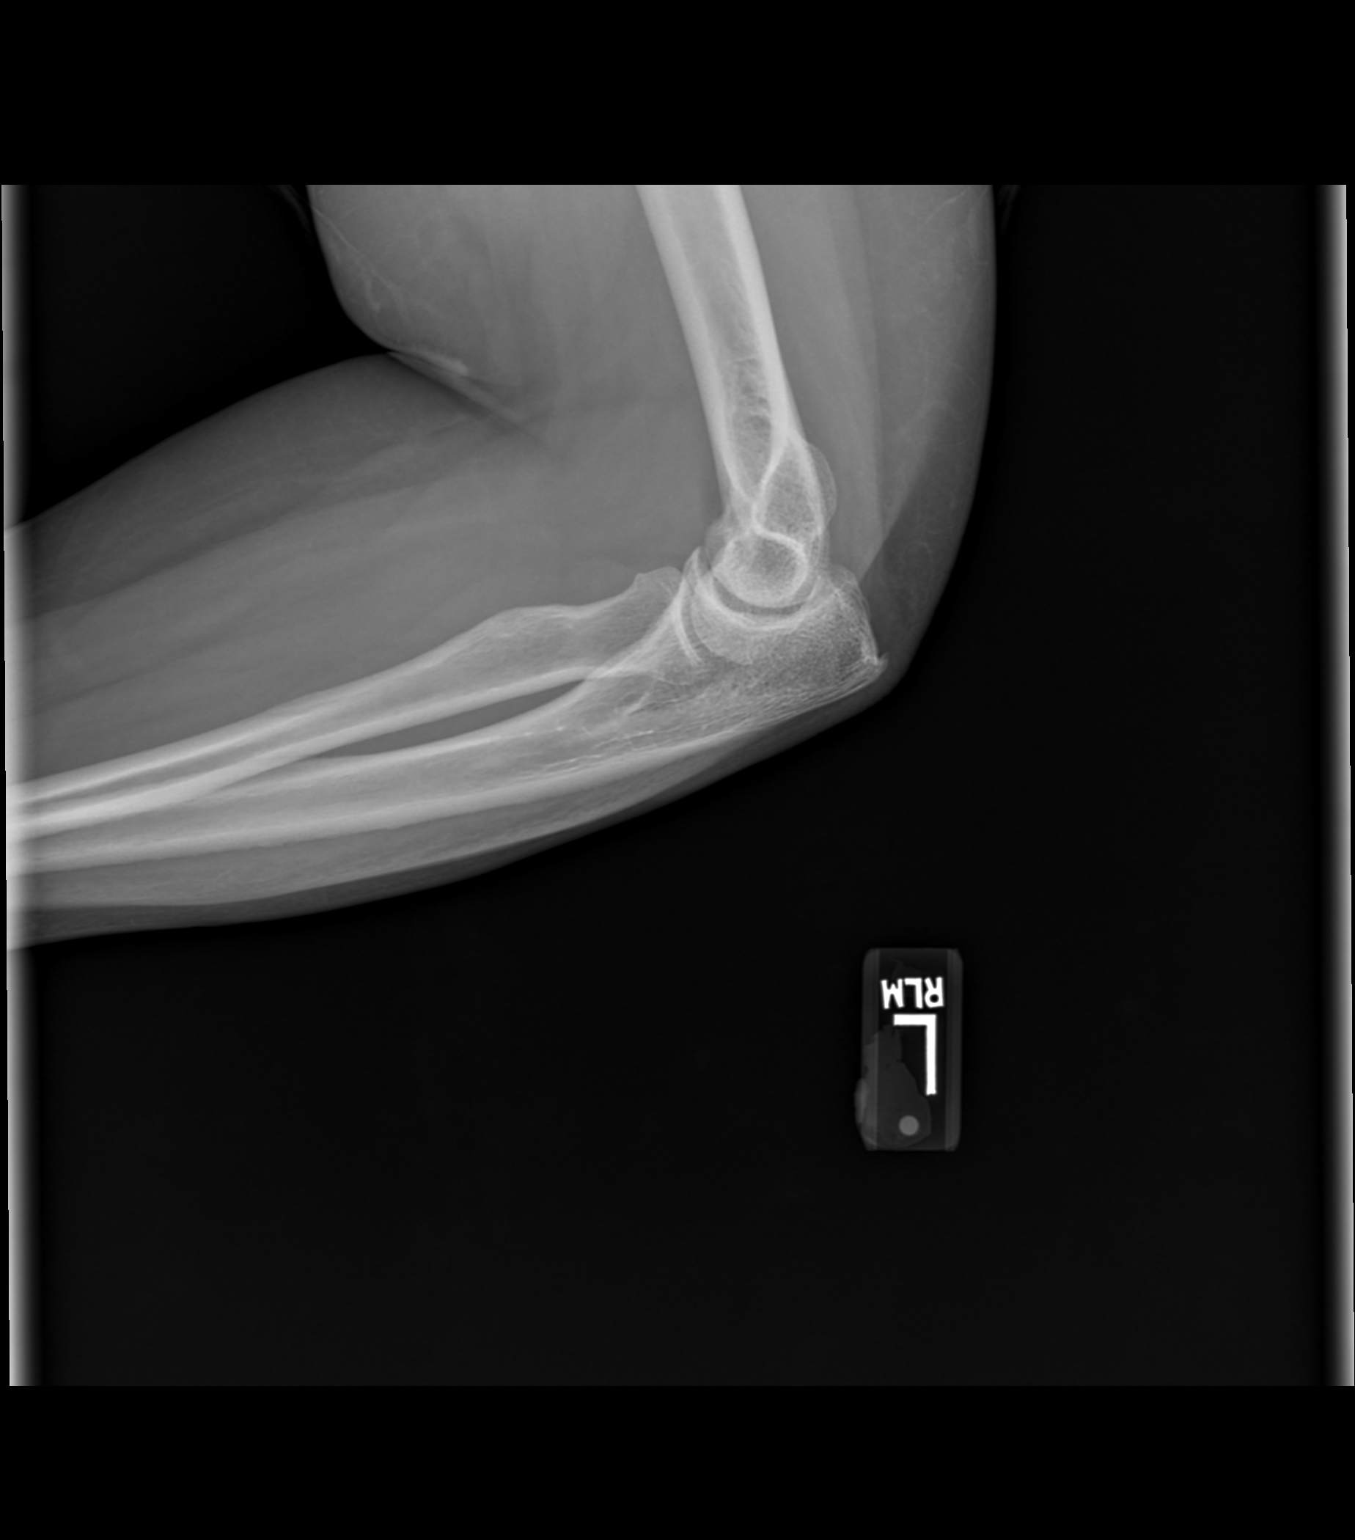

[2 of 2 positions shown; findings below may reference images not displayed]

FINDINGS: Trace cortical irregularity of the radial head. No discrete elbow 
joint effusion. Normal alignment. No focal soft tissue swelling.
IMPRESSION: Increased cortical irregularity of the radial head, fracture cannot be excluded. 
Consideration could made for MR exam of the elbow.

## 2021-11-26 IMAGING — DX SCAPULA LEFT 2 VIEWS
1 series · 2 of 2 positions shown · non-contrast
Comparison: None.

________________________________________________________________________________________________ 
SCAPULA LEFT 2 VIEWS, 11/26/2021 [DATE]: 
CLINICAL INDICATION: Fall occurred twice 2 days ago onto left arm and then again 
the left arm stretched out. Pain. History of left wrist fracture years ago.

[Series 1: lateral · 0.14mm/px · 2 of 2 slices shown]
[im 1/2]
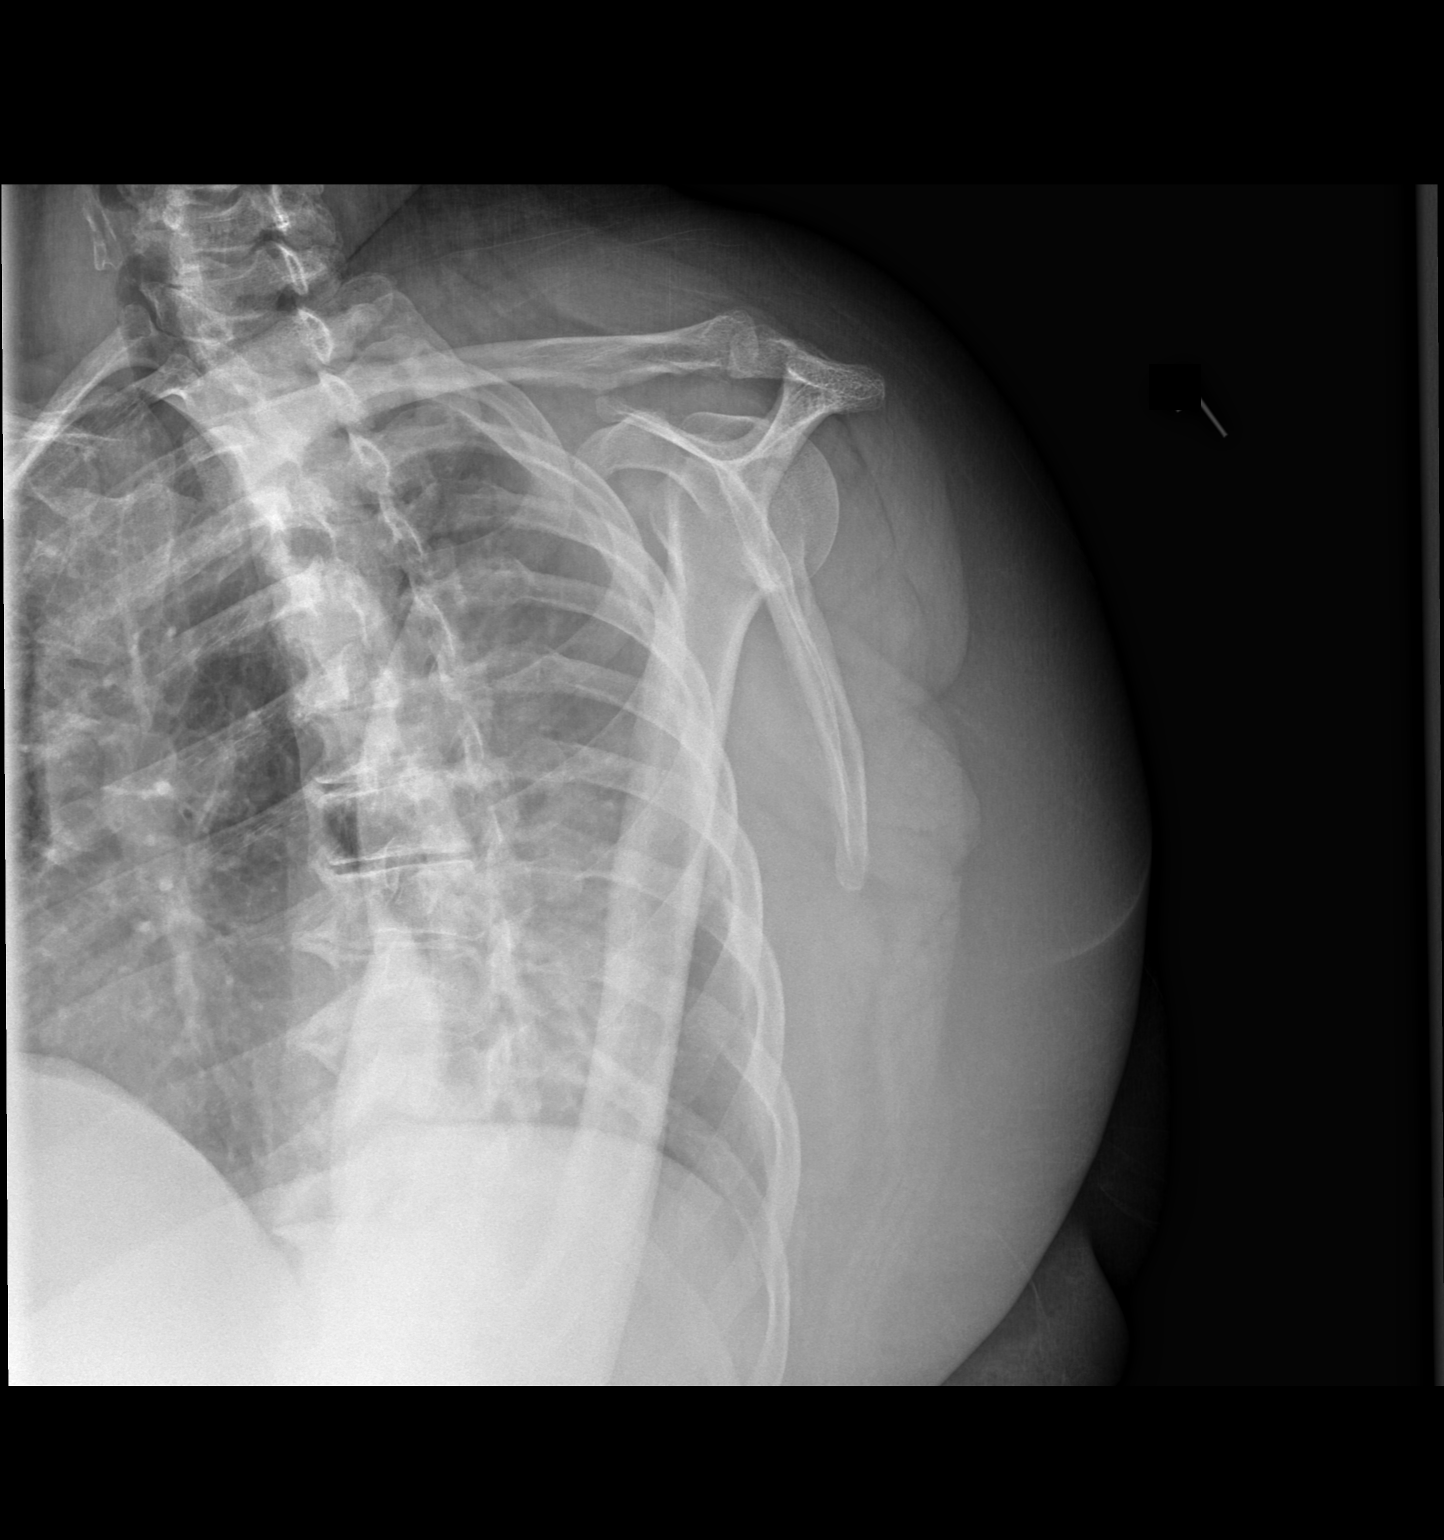
[im 2/2]
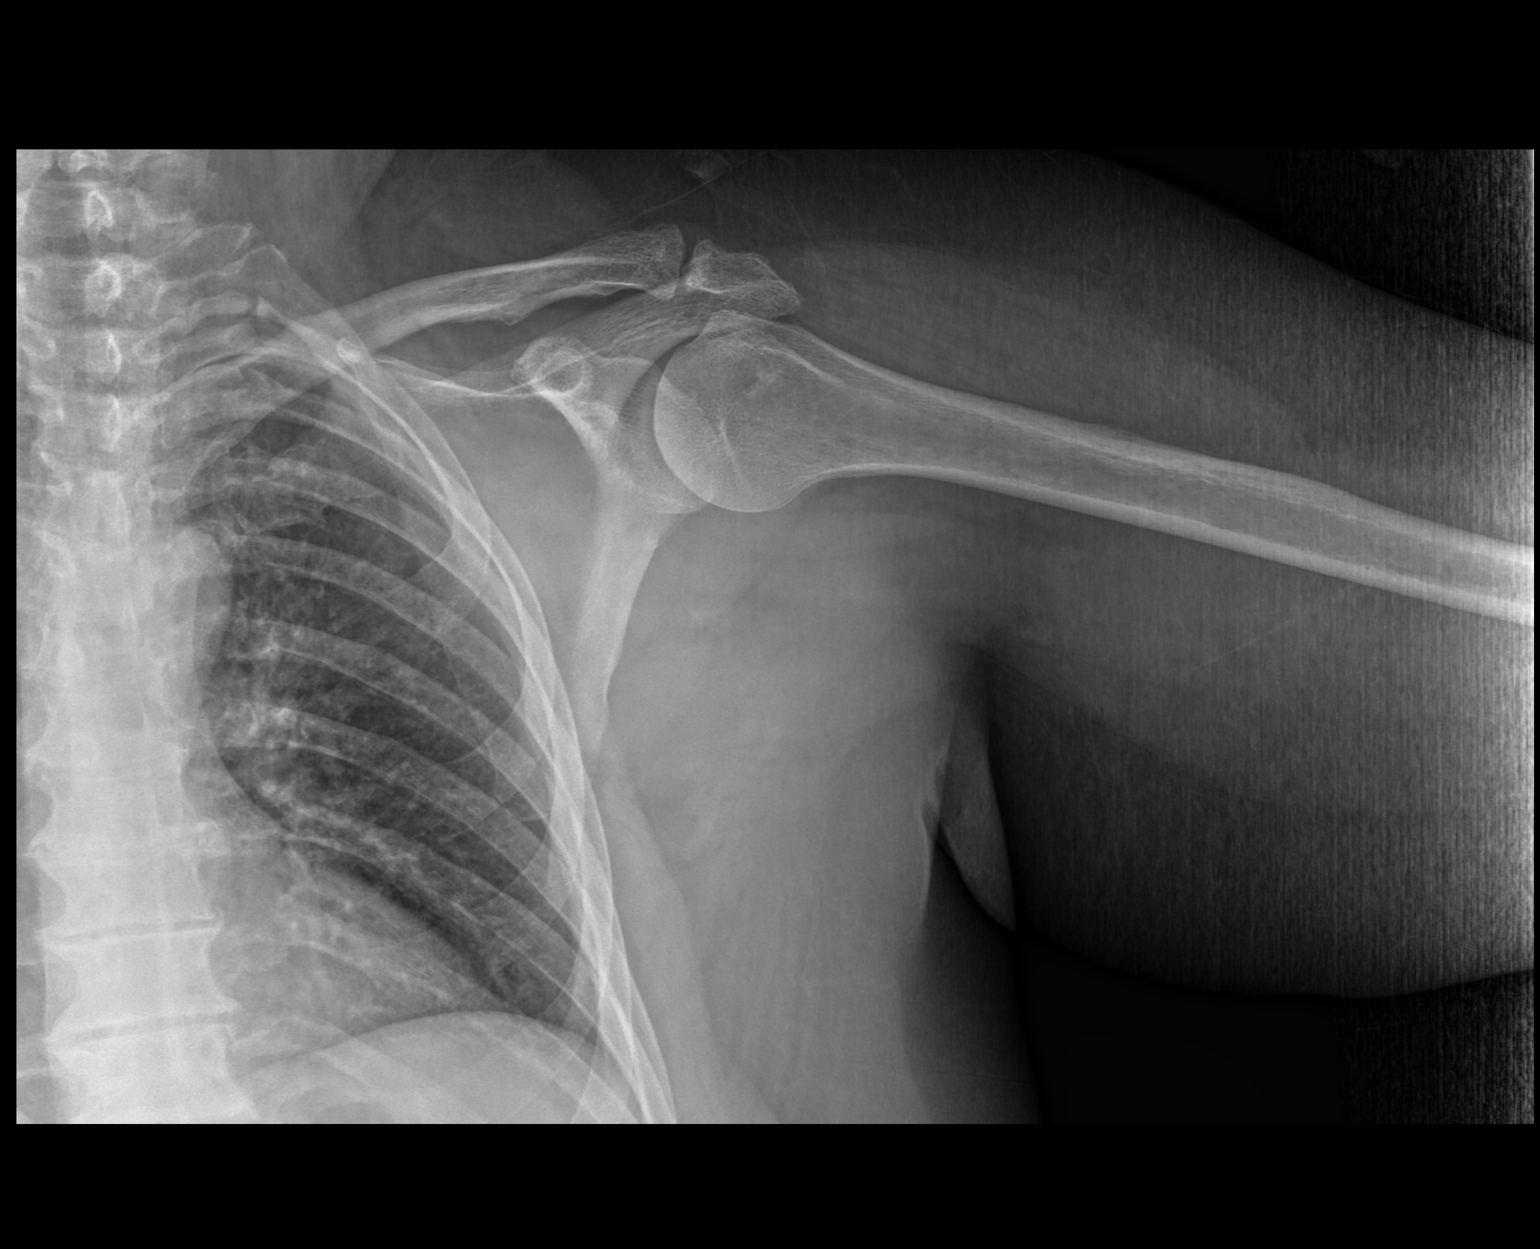

[2 of 2 positions shown; findings below may reference images not displayed]

FINDINGS: No fracture. Normal alignment. Joint spaces are preserved. No focal 
soft tissue swelling.
IMPRESSION: Negative exam.

## 2021-11-26 IMAGING — DX WRIST LEFT 3 VIEWS
1 series · 3 of 3 positions shown · non-contrast
Comparison: None.

________________________________________________________________________________________________ 
WRIST LEFT 3 VIEWS, 11/26/2021 [DATE]: 
CLINICAL INDICATION: Fall occurred twice 2 days ago onto left arm and then again 
the left arm stretched out. Pain. History of left wrist fracture years ago.

[Series 1: lateral · 0.14mm/px · 3 of 3 slices shown]
[im 1/3]
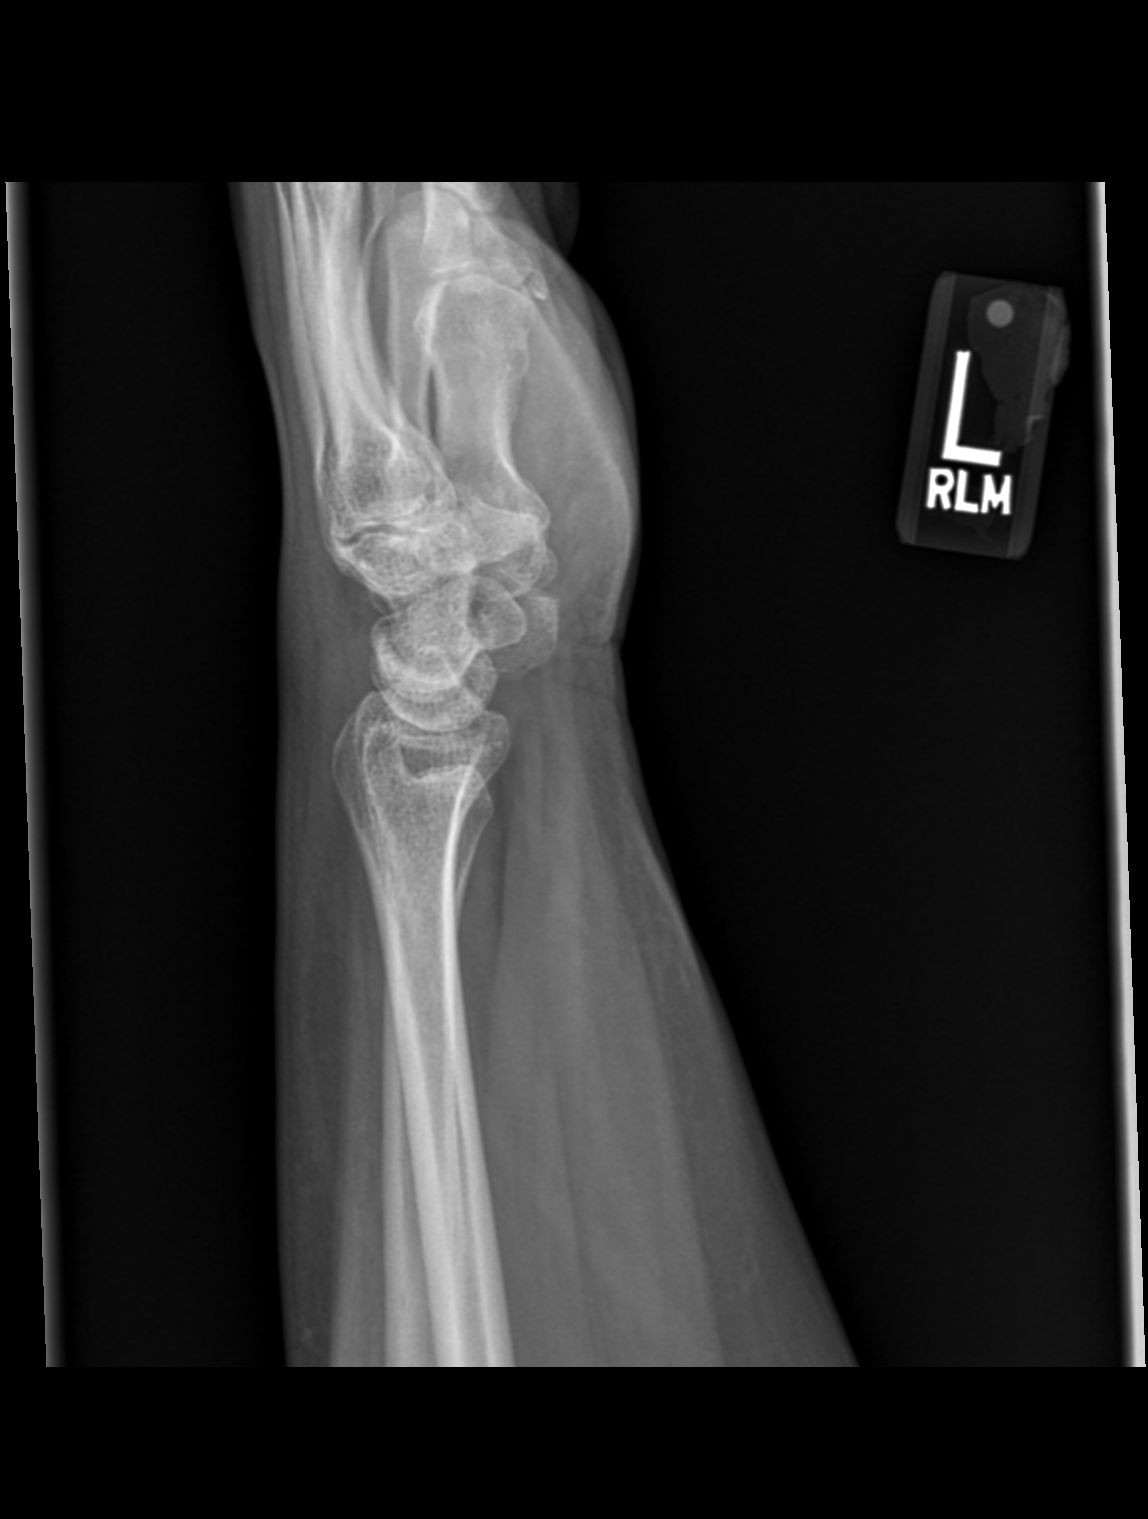
[im 2/3]
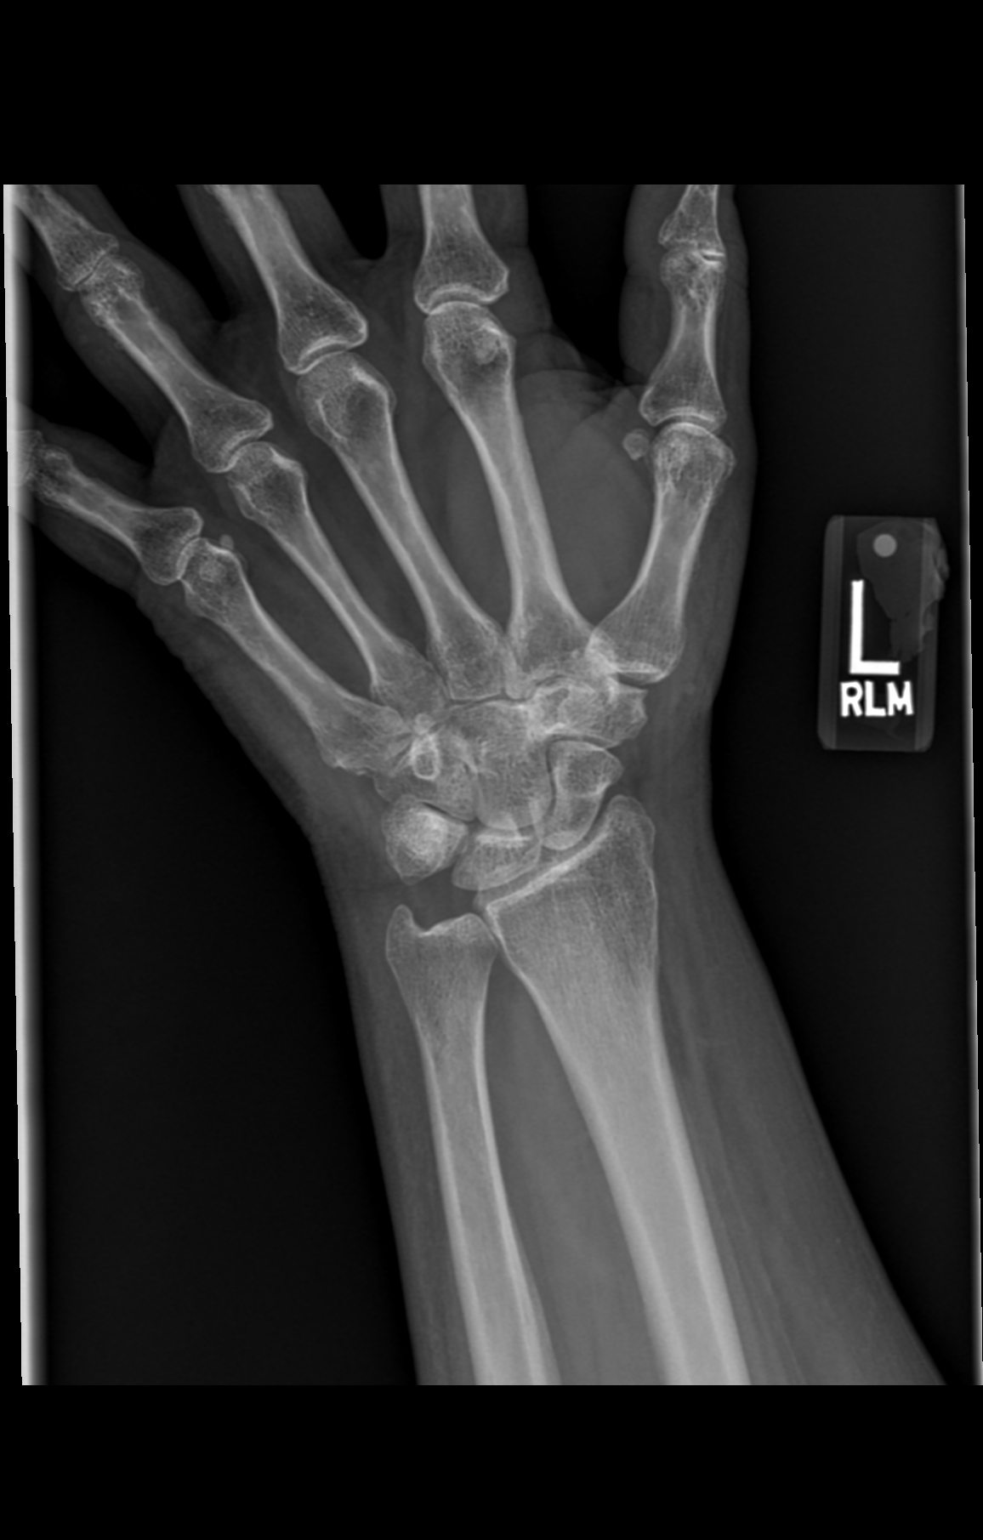
[im 3/3]
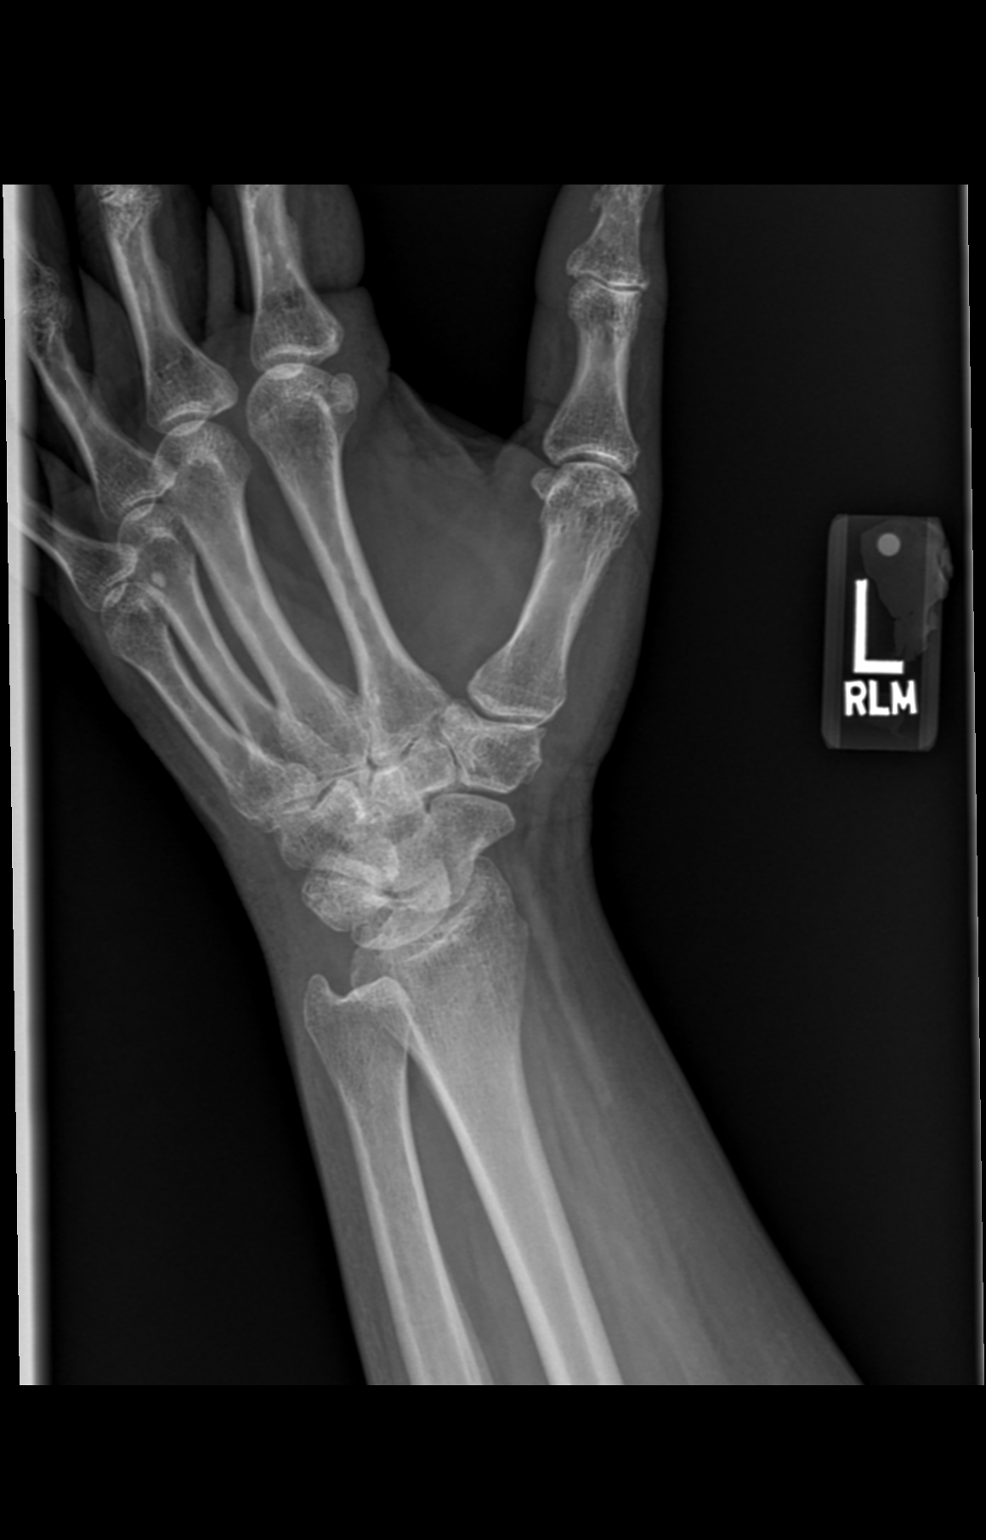

[3 of 3 positions shown; findings below may reference images not displayed]

FINDINGS: No fracture. Normal alignment. Joint spaces are preserved. No focal 
soft tissue swelling.
IMPRESSION: Negative exam.

## 2021-11-27 IMAGING — MR MRI LEFT SHOULDER WITHOUT CONTRAST
4 of 6 series · 19 of 40 positions shown · IV contrast (gadolinium)
Comparison: 11/26/2021 radiographs

________________________________________________________________________________________________ 
MRI LEFT SHOULDER WITHOUT CONTRAST, 11/27/2021 [DATE]: 
CLINICAL INDICATION: Pain. Patient fell 2 days ago.
TECHNIQUE: Multiplanar, multiecho position MR images of the shoulder were 
performed without intravenous gadolinium enhancement. Patient was scanned on a 
3T magnet.

[Series 201: survey_left · axial · 10.0mm · 0.99mm/px · z∈[-40,+175]mm · 5 of 15 slices shown]
[im 1/15]
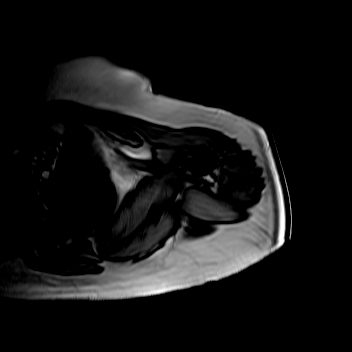
[im 4/15]
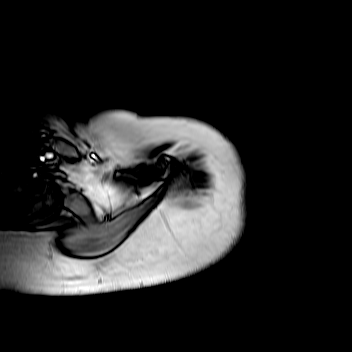
[im 8/15]
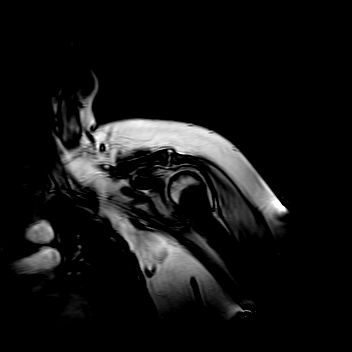
[im 11/15]
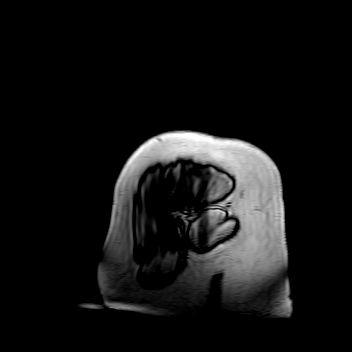
[im 15/15]
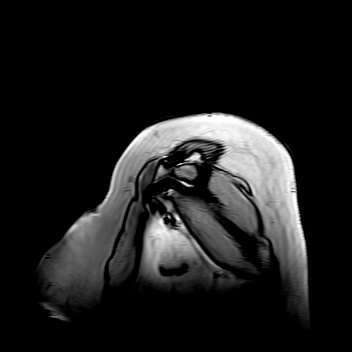

[Series 301: pdw spair ax · axial · 2.2mm · 0.25mm/px · z∈[-92,-20]mm · 8 of 36 slices shown]
[im 1/36]
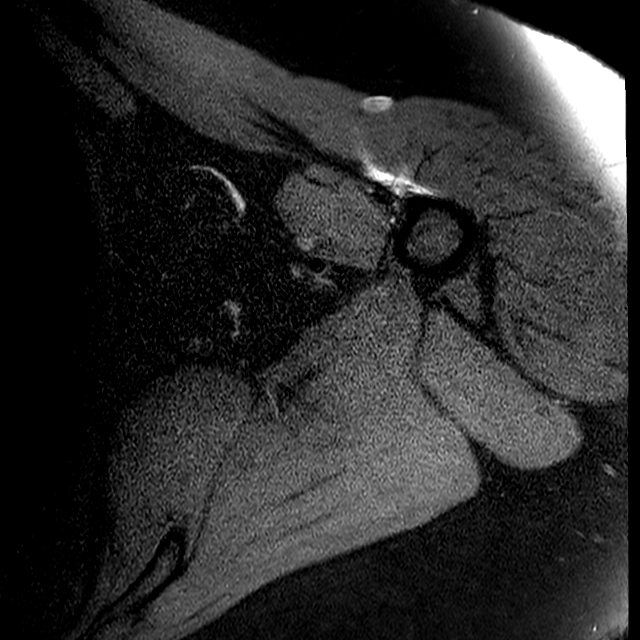
[im 5/36]
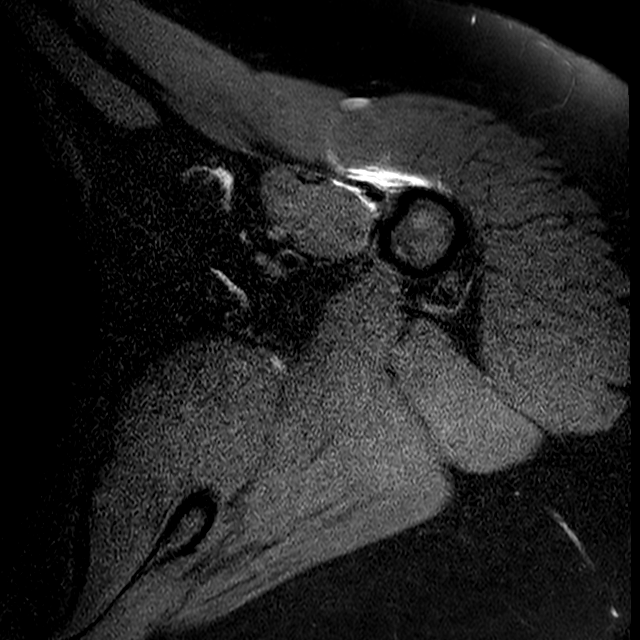
[im 9/36]
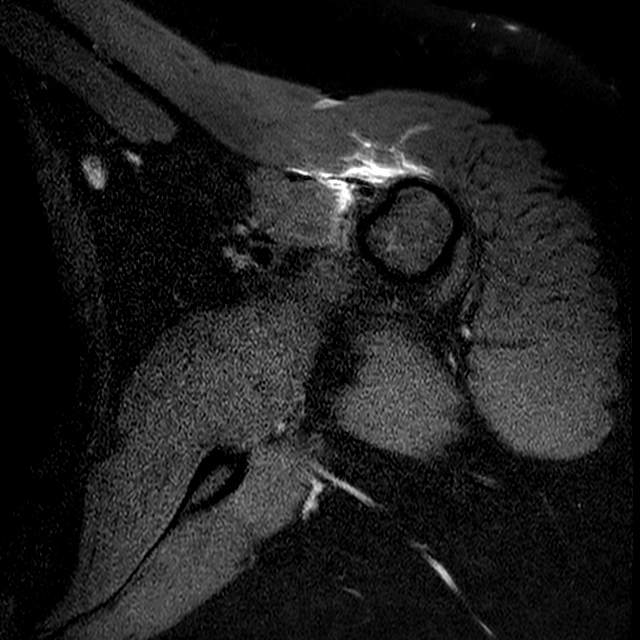
[im 14/36]
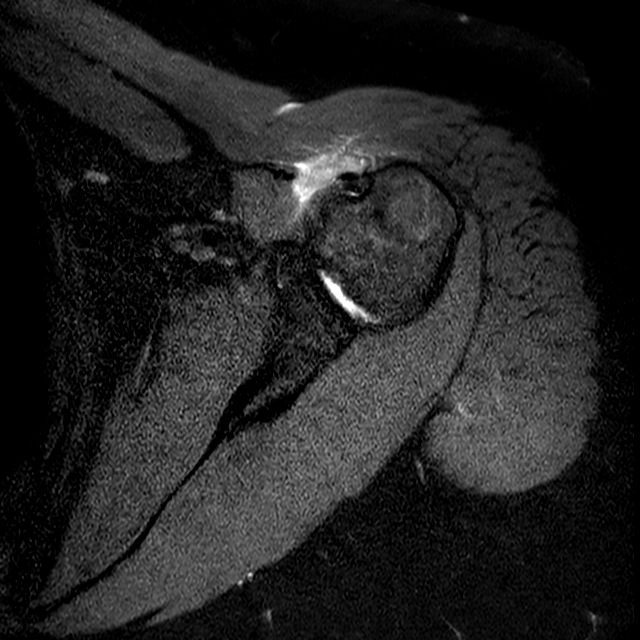
[im 18/36]
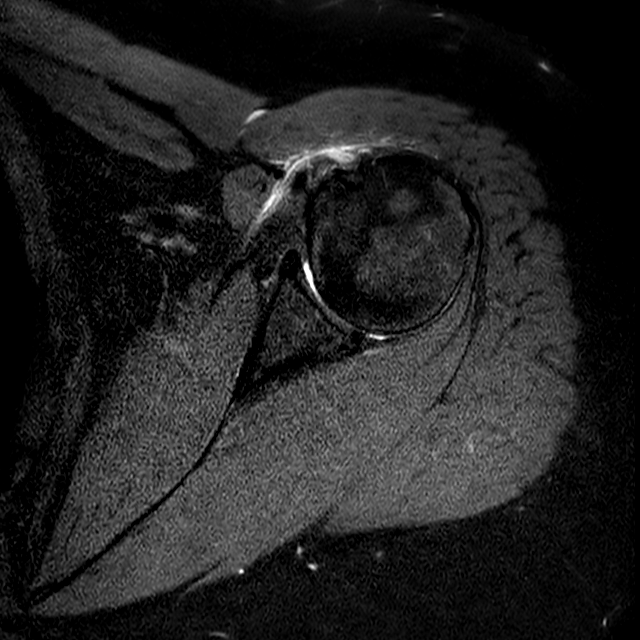
[im 22/36]
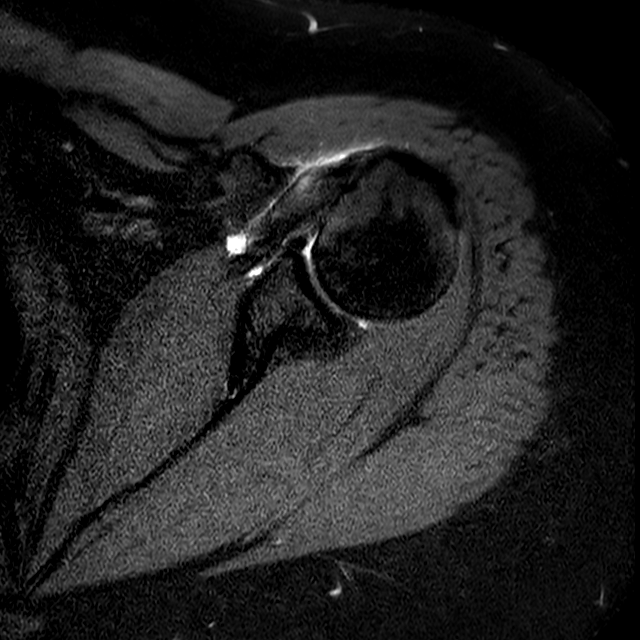
[im 27/36]
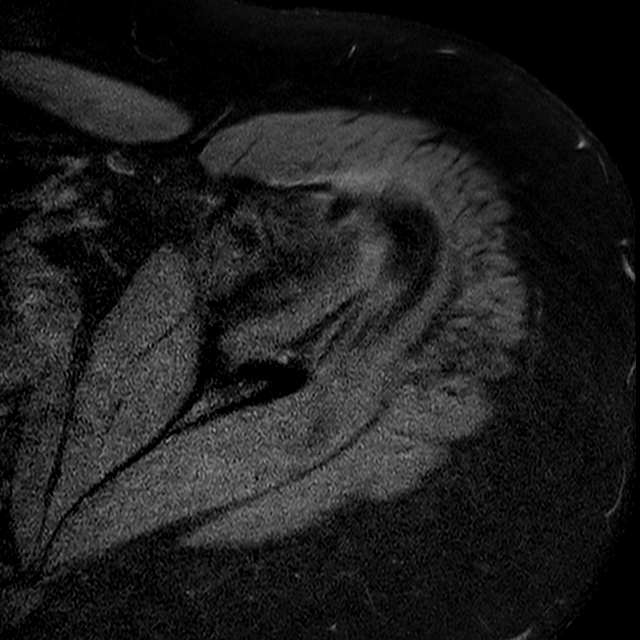
[im 31/36]
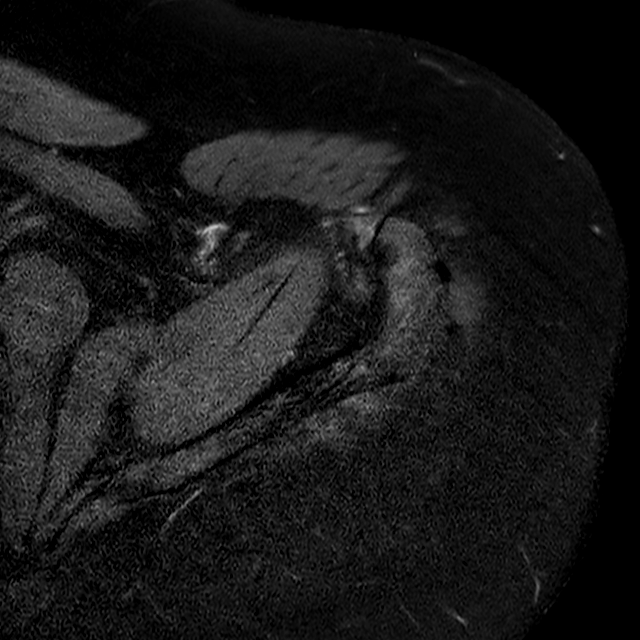

[Series 401: t2w spair sag · oblique · 3.2mm · 0.38mm/px · 3 of 28 slices shown]
[im 5/28]
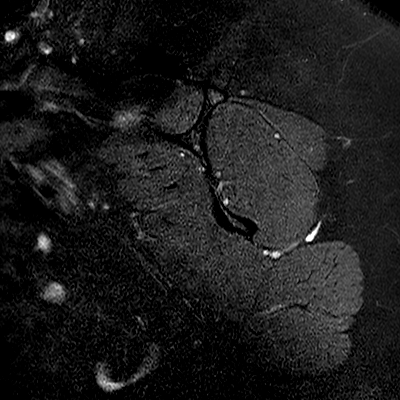
[im 14/28]
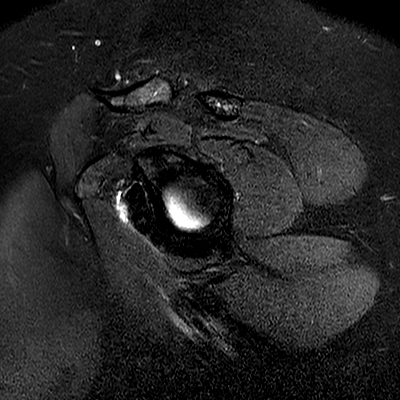
[im 23/28]
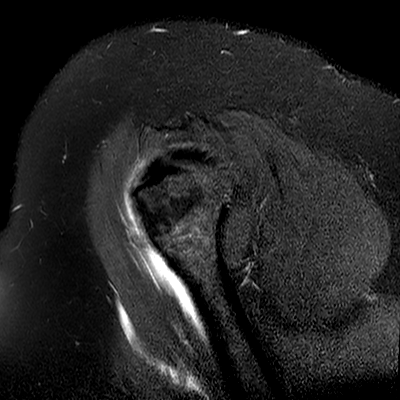

[Series 501: t2w spair cor · oblique · 2.5mm · 0.39mm/px · 3 of 24 slices shown]
[im 5/24]
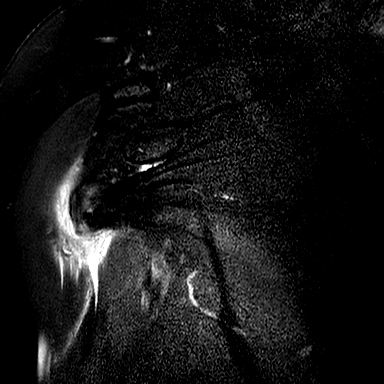
[im 14/24]
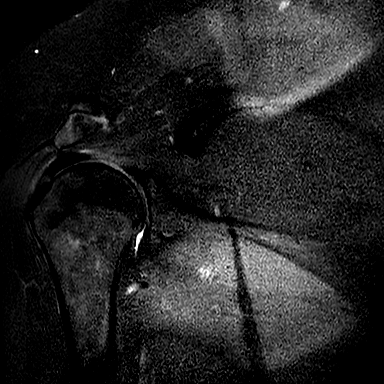
[im 24/24]
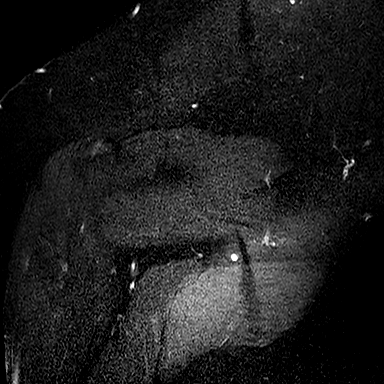

[19 of 40 positions shown; findings below may reference images not displayed]

FINDINGS: ROTATOR CUFF: 0.8 cm ovoid signal void in the distal subscapularis tendon with 
partial extrusion into the subacromial/subdeltoid bursa. Adjacent fluid in the 
subacromial/subdeltoid bursa and soft tissue swelling/small amount of fluid 
dissecting inferiorly along the proximal humerus and the deltoid muscle. The 
supraspinatus, infraspinatus and teres minor tendons are intact. The rotator 
cuff musculature is symmetric without mass, signal abnormality or atrophy. 
ACROMIOCLAVICULAR JOINT: Mild degenerative change of the acromioclavicular joint 
without mass effect upon the supraspinatus. The coracoacromial ligament is 
intact without prominent spurring at the acromial attachment. The 
acromioclavicular and coracoclavicular ligaments are preserved. The acromium is 
normal in morphology. 
GLENOHUMERAL JOINT: The humeral head is well located within the glenoid fossa. 
Articular cartilage is preserved.  The glenoid labrum is preserved. No 
paralabral cyst. The intra-articular portion of the long head of the biceps 
tendon is negative. No shoulder joint effusion. 
BONES: The bone marrow signal intensity is negative for fracture. No Hill-Sachs 
defect. Subcortical cystic change of the humeral head. 
ADDITIONAL FINDINGS: Subcutaneous tissues are negative.
IMPRESSION: 1.  Subscapularis calcific tendinosis with partial extrusion into the bursa and 
adjacent bursitis and anterior shoulder soft tissue swelling. 
2.  No fracture or rotator cuff tear.

## 2023-09-30 IMAGING — MR MRI LEFT SHOULDER WITHOUT CONTRAST
5 of 7 series · 26 of 40 positions shown · IV contrast (gadolinium)
Comparison: 11/27/2021

________________________________________________________________________________________________ 
MRI LEFT SHOULDER WITHOUT CONTRAST, 09/30/2023 [DATE]: 
CLINICAL INDICATION: Primary Osteoarthritis, Left Shoulder
TECHNIQUE: Multiplanar, multiecho position MR images of the shoulder were 
performed without intravenous gadolinium enhancement. Patient was scanned on a 
3T magnet.

[Series 201: survey_fullfov_transversal · axial · 10.0mm · 1.84mm/px · z∈[-82,+82]mm · 3 of 12 slices shown]
[im 1/12]
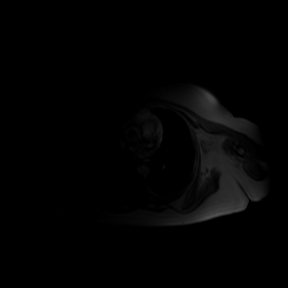
[im 6/12]
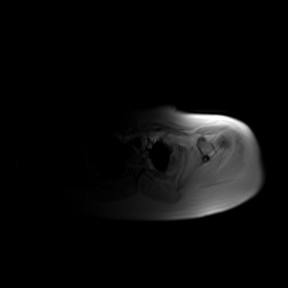
[im 12/12]
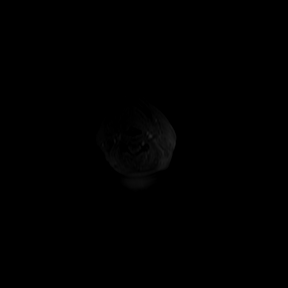

[Series 301: survey_left · axial · 10.0mm · 0.99mm/px · z∈[-40,+175]mm · 3 of 15 slices shown]
[im 1/15]
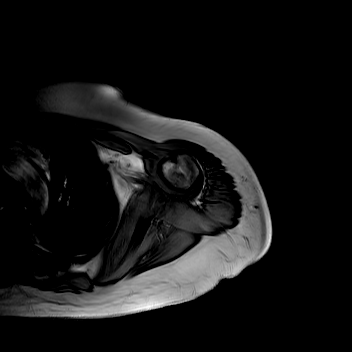
[im 8/15]
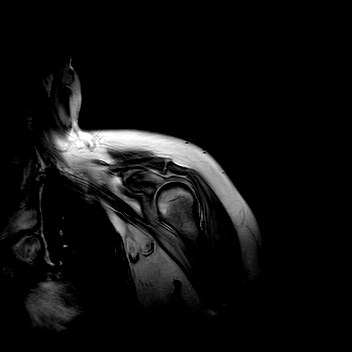
[im 15/15]
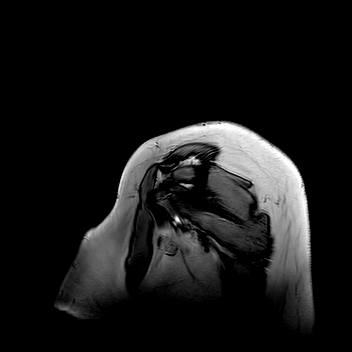

[Series 401: pdw spair left · axial · 2.5mm · 0.27mm/px · z∈[-113,-28]mm · 8 of 36 slices shown]
[im 1/36]
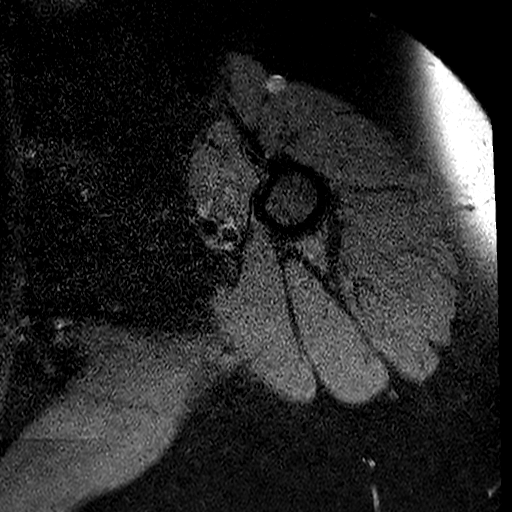
[im 6/36]
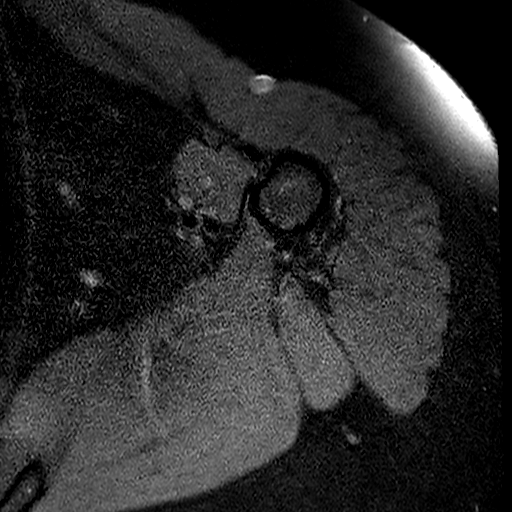
[im 11/36]
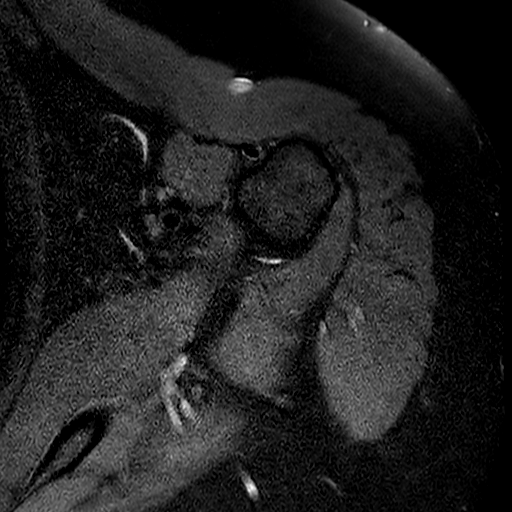
[im 16/36]
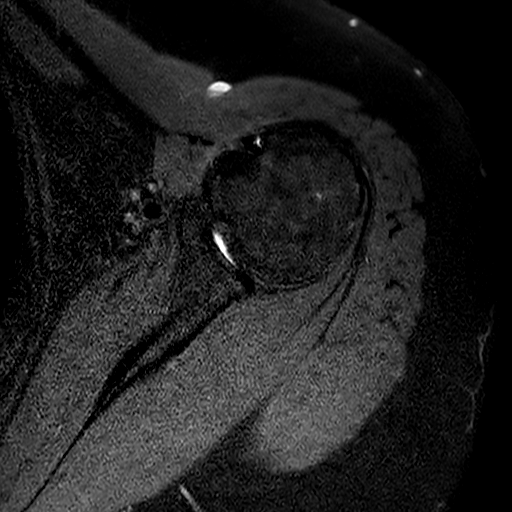
[im 21/36]
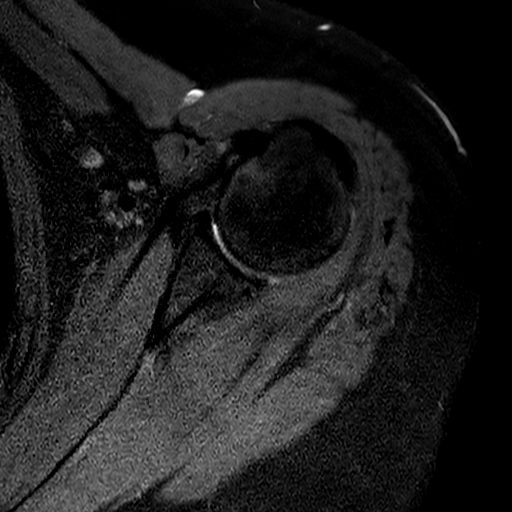
[im 26/36]
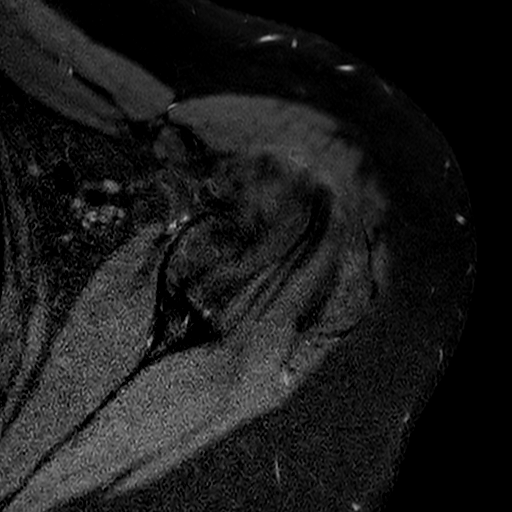
[im 31/36]
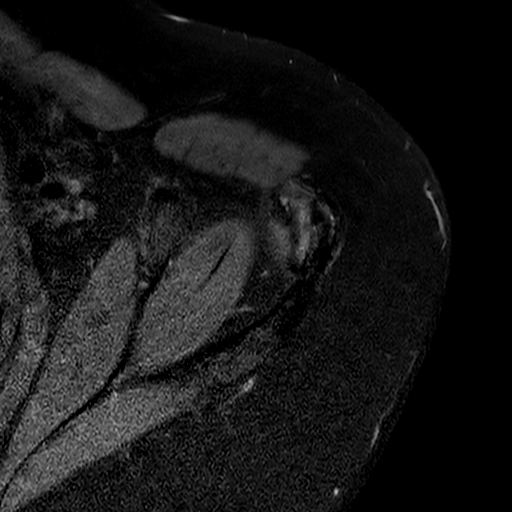
[im 36/36]
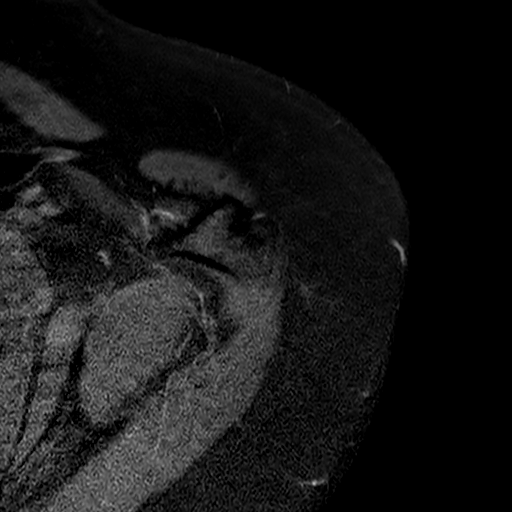

[Series 501: t2w spair sag · oblique · 3.2mm · 0.38mm/px · 7 of 30 slices shown]
[im 1/30]
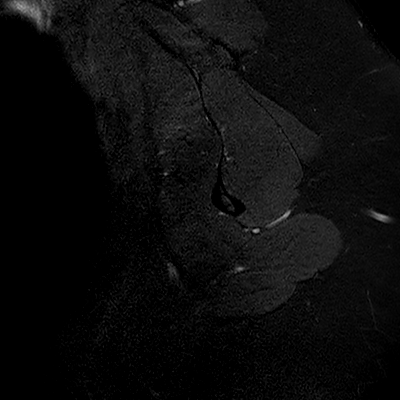
[im 5/30]
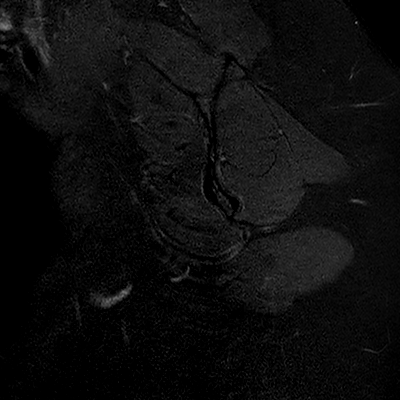
[im 10/30]
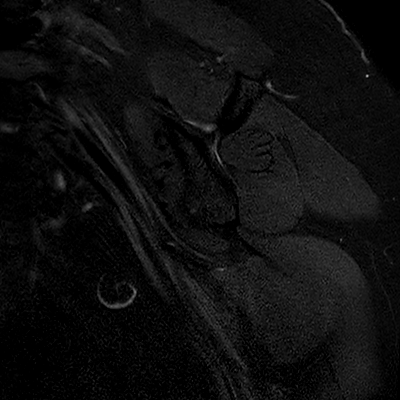
[im 15/30]
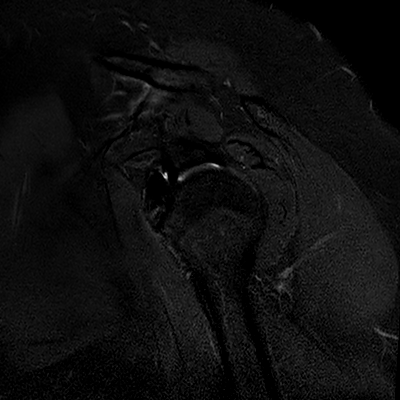
[im 20/30]
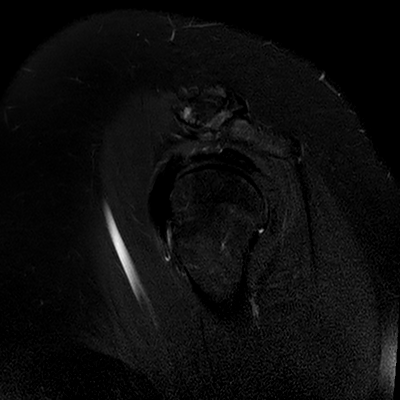
[im 25/30]
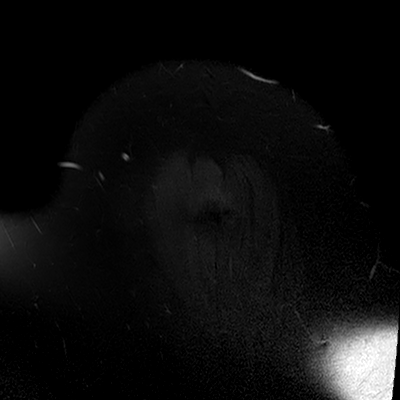
[im 30/30]
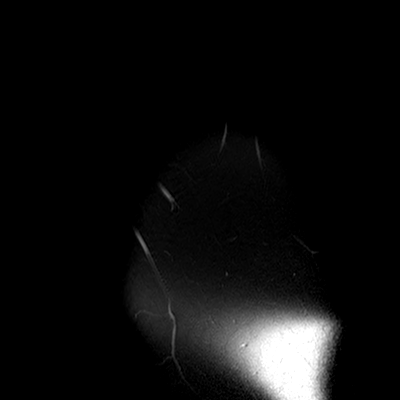

[Series 601: t2w spair cor · oblique · 2.5mm · 0.38mm/px · 5 of 27 slices shown]
[im 1/27]
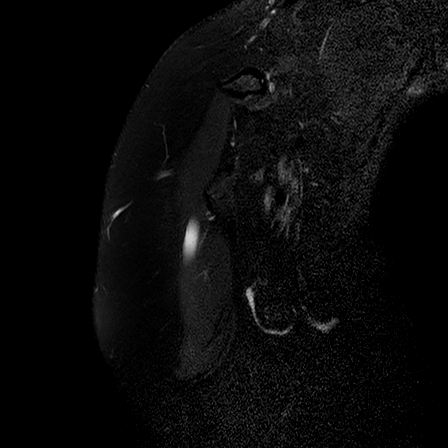
[im 6/27]
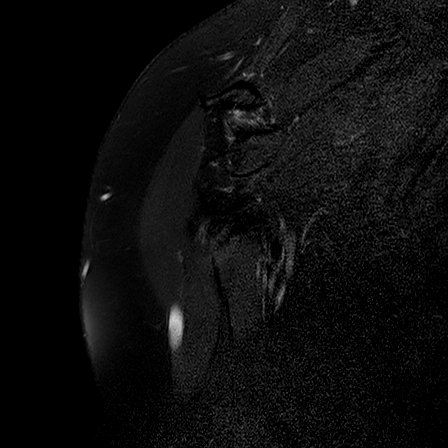
[im 11/27]
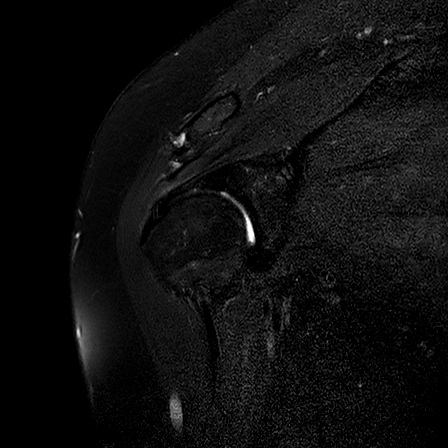
[im 16/27]
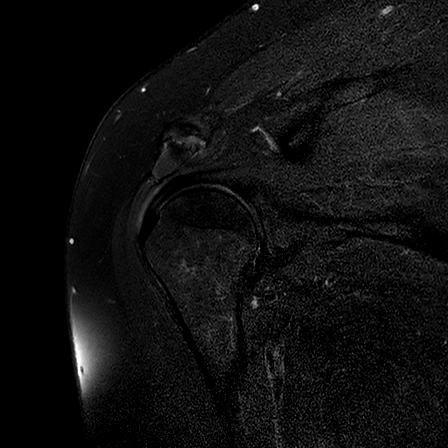
[im 21/27]
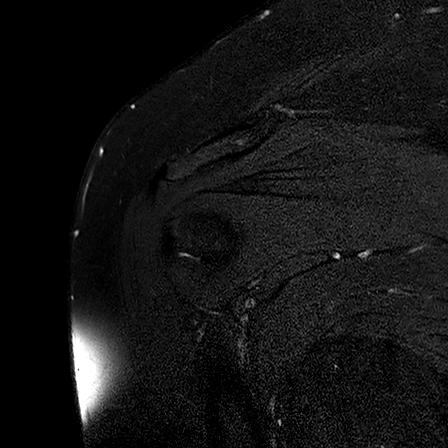

[26 of 40 positions shown; findings below may reference images not displayed]

FINDINGS: ROTATOR CUFF: Resolution of the distal subscapularis tendon signal void in 
adjacent soft tissue swelling. New 0.3 cm signal void in the anterior distal 
supraspinatus tendon, suspicious for calcific tendinosis. The supraspinatus, 
infraspinatus and teres minor tendons are intact. The rotator cuff musculature 
is symmetric without mass, signal abnormality or atrophy. 
ACROMIOCLAVICULAR JOINT: Mild degenerative change of the acromioclavicular joint 
without mass effect upon the supraspinatus. The coracoacromial ligament is 
intact without prominent spurring at the acromial attachment. The 
acromioclavicular and coracoclavicular ligaments are preserved. The acromium is 
normal in morphology. 
GLENOHUMERAL JOINT: The humeral head is well located within the glenoid fossa. 
Articular cartilage is preserved.  The glenoid labrum is preserved. No 
paralabral cyst. The intra-articular portion of the long head of the biceps 
tendon is negative. No shoulder joint effusion. 
BONES: The bone marrow signal intensity is negative for fracture. No Hill-Sachs 
defect. Subcortical cystic change of the humeral head. 
ADDITIONAL FINDINGS: Subcutaneous tissues are negative.
IMPRESSION: 1.  Resolution of the prior subscapularis calcific tendinosis and new 0.3 cm 
signal void in the anterior distal supraspinatus tendon, suspicious for calcific 
tendinosis.  
2.  No rotator cuff tear. 
3.  Mild AC joint degenerative change without mass effect upon the 
supraspinatus.
